# Patient Record
Sex: Male | Born: 1975 | Hispanic: Yes | Marital: Married | Smoking: Never smoker
Health system: Southern US, Community
[De-identification: ages and names within clinical notes are randomized; demographics above are authoritative.]

## PROBLEM LIST (undated history)

## (undated) HISTORY — PX: BRAIN SURGERY: SHX531

---

## 2014-01-16 ENCOUNTER — Encounter (HOSPITAL_COMMUNITY): Payer: Self-pay | Admitting: Emergency Medicine

## 2014-01-16 ENCOUNTER — Emergency Department (HOSPITAL_COMMUNITY): Payer: BC Managed Care – HMO

## 2014-01-16 ENCOUNTER — Emergency Department (HOSPITAL_COMMUNITY)
Admission: EM | Admit: 2014-01-16 | Discharge: 2014-01-16 | Disposition: A | Discharge status: Home / Self Care | Payer: BC Managed Care – HMO | Attending: Emergency Medicine | Admitting: Emergency Medicine

## 2014-01-16 ENCOUNTER — Emergency Department (INDEPENDENT_AMBULATORY_CARE_PROVIDER_SITE_OTHER)
Admission: EM | Admit: 2014-01-16 | Discharge: 2014-01-16 | Disposition: A | Discharge status: Nursing Home (Includes ICF) | Payer: BC Managed Care – HMO | Source: Home / Self Care | Attending: Emergency Medicine | Admitting: Emergency Medicine

## 2014-01-16 DIAGNOSIS — R079 Chest pain, unspecified: Secondary | ICD-10-CM | POA: Insufficient documentation

## 2014-01-16 DIAGNOSIS — Z79899 Other long term (current) drug therapy: Secondary | ICD-10-CM | POA: Insufficient documentation

## 2014-01-16 LAB — CBC WITH DIFFERENTIAL/PLATELET
Basophils Absolute: 0 10*3/uL (ref 0.0–0.1)
Basophils Relative: 1 % (ref 0–1)
Eosinophils Absolute: 0.1 10*3/uL (ref 0.0–0.7)
Eosinophils Relative: 2 % (ref 0–5)
HCT: 43 % (ref 39.0–52.0)
HEMOGLOBIN: 14.7 g/dL (ref 13.0–17.0)
Lymphocytes Relative: 44 % (ref 12–46)
Lymphs Abs: 2.5 10*3/uL (ref 0.7–4.0)
MCH: 30.8 pg (ref 26.0–34.0)
MCHC: 34.2 g/dL (ref 30.0–36.0)
MCV: 90.1 fL (ref 78.0–100.0)
MONOS PCT: 7 % (ref 3–12)
Monocytes Absolute: 0.4 10*3/uL (ref 0.1–1.0)
NEUTROS ABS: 2.6 10*3/uL (ref 1.7–7.7)
NEUTROS PCT: 46 % (ref 43–77)
Platelets: 210 10*3/uL (ref 150–400)
RBC: 4.77 MIL/uL (ref 4.22–5.81)
RDW: 12.4 % (ref 11.5–15.5)
WBC: 5.6 10*3/uL (ref 4.0–10.5)

## 2014-01-16 LAB — BASIC METABOLIC PANEL
BUN: 17 mg/dL (ref 6–23)
CHLORIDE: 104 meq/L (ref 96–112)
CO2: 27 mEq/L (ref 19–32)
Calcium: 9.2 mg/dL (ref 8.4–10.5)
Creatinine, Ser: 1.02 mg/dL (ref 0.50–1.35)
GFR calc non Af Amer: >90 mL/min (ref >90)
GLUCOSE: 105 mg/dL — AB (ref 70–99)
POTASSIUM: 4.5 meq/L (ref 3.7–5.3)
Sodium: 141 mEq/L (ref 137–147)

## 2014-01-16 LAB — I-STAT TROPONIN, ED: Troponin i, poc: 0.03 ng/mL (ref 0.00–0.08)

## 2014-01-16 MED ORDER — ASPIRIN 81 MG PO CHEW
CHEWABLE_TABLET | ORAL | Status: AC
  Filled 2014-01-16: qty 4

## 2014-01-16 MED ORDER — SODIUM CHLORIDE 0.9 % IV SOLN
INTRAVENOUS | Status: DC
  Administered 2014-01-16: 10:00:00 via INTRAVENOUS

## 2014-01-16 MED ORDER — NITROGLYCERIN 0.4 MG SL SUBL
SUBLINGUAL_TABLET | SUBLINGUAL | Status: AC
  Filled 2014-01-16: qty 1

## 2014-01-16 MED ORDER — NITROGLYCERIN 0.4 MG SL SUBL
0.4000 mg | SUBLINGUAL_TABLET | SUBLINGUAL | Status: AC | PRN
  Administered 2014-01-16: 0.4 mg via SUBLINGUAL

## 2014-01-16 MED ORDER — ASPIRIN 81 MG PO CHEW
324.0000 mg | CHEWABLE_TABLET | Freq: Once | ORAL | Status: AC
  Administered 2014-01-16: 324 mg via ORAL

## 2014-01-16 NOTE — Discharge Instructions (Signed)
Chest Pain (Nonspecific) °It is often hard to give a specific diagnosis for the cause of chest pain. There is always a chance that your pain could be related to something serious, such as a heart attack or a blood clot in the lungs. You need to follow up with your caregiver for further evaluation. °CAUSES  °· Heartburn. °· Pneumonia or bronchitis. °· Anxiety or stress. °· Inflammation around your heart (pericarditis) or lung (pleuritis or pleurisy). °· A blood clot in the lung. °· A collapsed lung (pneumothorax). It can develop suddenly on its own (spontaneous pneumothorax) or from injury (trauma) to the chest. °· Shingles infection (herpes zoster virus). °The chest wall is composed of bones, muscles, and cartilage. Any of these can be the source of the pain. °· The bones can be bruised by injury. °· The muscles or cartilage can be strained by coughing or overwork. °· The cartilage can be affected by inflammation and become sore (costochondritis). °DIAGNOSIS  °Lab tests or other studies, such as X-rays, electrocardiography, stress testing, or cardiac imaging, may be needed to find the cause of your pain.  °TREATMENT  °· Treatment depends on what may be causing your chest pain. Treatment may include: °· Acid blockers for heartburn. °· Anti-inflammatory medicine. °· Pain medicine for inflammatory conditions. °· Antibiotics if an infection is present. °· You may be advised to change lifestyle habits. This includes stopping smoking and avoiding alcohol, caffeine, and chocolate. °· You may be advised to keep your head raised (elevated) when sleeping. This reduces the chance of acid going backward from your stomach into your esophagus. °· Most of the time, nonspecific chest pain will improve within 2 to 3 days with rest and mild pain medicine. °HOME CARE INSTRUCTIONS  °· If antibiotics were prescribed, take your antibiotics as directed. Finish them even if you start to feel better. °· For the next few days, avoid physical  activities that bring on chest pain. Continue physical activities as directed. °· Do not smoke. °· Avoid drinking alcohol. °· Only take over-the-counter or prescription medicine for pain, discomfort, or fever as directed by your caregiver. °· Follow your caregiver's suggestions for further testing if your chest pain does not go away. °· Keep any follow-up appointments you made. If you do not go to an appointment, you could develop lasting (chronic) problems with pain. If there is any problem keeping an appointment, you must call to reschedule. °SEEK MEDICAL CARE IF:  °· You think you are having problems from the medicine you are taking. Read your medicine instructions carefully. °· Your chest pain does not go away, even after treatment. °· You develop a rash with blisters on your chest. °SEEK IMMEDIATE MEDICAL CARE IF:  °· You have increased chest pain or pain that spreads to your arm, neck, jaw, back, or abdomen. °· You develop shortness of breath, an increasing cough, or you are coughing up blood. °· You have severe back or abdominal pain, feel nauseous, or vomit. °· You develop severe weakness, fainting, or chills. °· You have a fever. °THIS IS AN EMERGENCY. Do not wait to see if the pain will go away. Get medical help at once. Call your local emergency services (911 in [place] Do not drive yourself to the hospital. °MAKE SURE YOU:  °· Understand these instructions. °· Will watch your condition. °· Will get help right away if you are not doing well or get worse. °Document Released: 05/06/2005 Document Revised: 10/19/2011 Document Reviewed: 03/01/2008 °ExitCare® Patient Information ©2014 [employer],   LLC. ° ° ° °Emergency Department Resource Guide °1) Find a Doctor and Pay Out of Pocket °Although you won't have to find out who is covered by your insurance plan, it is a good idea to ask around and get recommendations. You will then need to call the office and see if the doctor you have chosen will accept you as a new  patient and what types of options they offer for patients who are self-pay. Some doctors offer discounts or will set up payment plans for their patients who do not have insurance, but you will need to ask so you aren't surprised when you get to your appointment. ° °2) Contact Your Local Health Department °Not all health departments have doctors that can see patients for sick visits, but many do, so it is worth a call to see if yours does. If you don't know where your local health department is, you can check in your phone book. The CDC also has a tool to help you locate your state's health department, and many state websites also have listings of all of their local health departments. ° °3) Find a Walk-in Clinic °If your illness is not likely to be very severe or complicated, you may want to try a walk in clinic. These are popping up all over the country in pharmacies, drugstores, and shopping centers. They're usually staffed by nurse practitioners or physician assistants that have been trained to treat common illnesses and complaints. They're usually fairly quick and inexpensive. However, if you have serious medical issues or chronic medical problems, these are probably not your best option. ° °No Primary Care Doctor: °- Call [facility] at  832-8000 - they can help you locate a primary care doctor that  accepts your insurance, provides certain services, etc. °- Physician Referral Service- 1-800-533-3463 ° °Chronic Pain Problems: °Organization         Address  Phone   Notes  °[facility]  (336) 297-2271 Patients need to be referred by their primary care doctor.  ° °Medication Assistance: °Organization         Address  Phone   Notes  °[place] [facility] [address] °[place], [place] [address] (336) 641-8030 --Must be a resident of [place] °-- Must have NO insurance coverage whatsoever (no Medicaid/ Medicare, etc.) °-- The pt. MUST have a primary  care doctor that directs their care regularly and follows them in the community °  °[facility]  (866) 331-1348   °[employer]  (888) 892-1162   ° °Agencies that provide inexpensive medical care: °Organization         Address  Phone   Notes  °[facility]  (336) 832-8035   °[facility]    (336) 832-7272   °[facility] [address] °[place], [place] [address] (336) 832-4777   °[facility] [address], °[place] (336) 271-4999   °[facility]    (336) 373-0678   °[facility]    (336) 272-1050   °[facility] ° [address], [place] Phone:  (336) 832-4444, Fax:  (336) 832-4440 Hours of Operation:  9 am - 6 pm, M-F.  Also accepts Medicaid/Medicare and self-pay.  °[facility] ° [address], [place] Phone: (336) [phone], Fax: (336) [phone]. Hours of Operation:  8:30 am - 5:30 pm, M-F.  Also accepts Medicaid and self-pay.  °  [facility] [place] [address], [place] Phone: (336) 878-6027   °[facility] [address], [place], [place] (336)723-1848, Ext. 123 Mondays & Thursdays: 7-9 AM.  First 15 patients are seen on a first come, first serve basis. °  ° °Medicaid-accepting [place] Providers: ° °Organization         Address  Phone   Notes  °[facility] [address], [place] (336) 641-2100 Also accepts self-pay patients.  °[facility] [address], [place] ° (336) 856-9996   °[facility] [address] [place] [address], [place] (336) 288-8857   °[facility] [address] [place] [address], [place] (336) 299-7000   °Veita Bland [address], [place]  ° (336) 373-1557 Only accepts [place] Access Medicaid patients after they have their name applied to their card.  ° °Self-Pay (no insurance) in [place]: ° °Organization         Address  Phone   Notes  °Sickle Cell Patients, [facility] [address], [place] (336) 832-1970   °[facility] [address], [place] (336) 832-4400   °[facility] ° [address], [place] (336) 992-4800   °[facility]/Dr. Osei-Bonsu ° [address] [place] [address], [employer] [place] or [address], [place] (336) [phone] Phone number for both [place] and [place] locations is the same.  °[facility] [address], [place] (336) 299-0000   °[facility] [address] [place] [address], [place] or [address] (336) 852-7530 °(336) 878-2260   °[facility] [address], [place] (336) 350-1642, phone; (336) 294-5005, fax Sees patients 1st and 3rd Saturday of every month.  Must not qualify for public or private insurance (i.e. Medicaid, Medicare, [place] Health Choice, Veterans' Benefits) • Household income should be no more than 200% of the poverty level •The clinic cannot treat you if you are pregnant or think you are pregnant • Sexually transmitted diseases are not treated at the clinic.  ° ° °Dental Care: °Organization         Address  Phone  Notes  °[facility] [facility] [address], [place] (336) 641-6152 Accepts children up to age 21 who are enrolled in Medicaid or [place] Health Choice; pregnant women with a Medicaid card; and children who have applied for Medicaid or [place] Health Choice, but were declined, whose parents can pay a reduced fee at time of service.  °[facility] [place]  [address], [place] (336) 641-7733 Accepts children up to age 21 who are enrolled in Medicaid or [place] Health Choice; pregnant women with a Medicaid card; and children who have applied for Medicaid or [place] Health Choice, but were declined, whose parents can  pay a reduced fee at time of service.  °[facility] ° [address],  (336) 641-4533 Patients are seen by appointment only. Walk-ins are not accepted. [facility] will see patients 18 years of age and older. °Monday - Tuesday (8am-5pm) °Most Wednesdays (8:30-5pm) °$30 per visit, cash only  °[facility] ° [address], [place] (336) 641-4533 Patients are seen by appointment only. Walk-ins are not accepted. [facility] will see patients 18 years of   age and older. °One Wednesday Evening (Monthly: Volunteer Based).  $30 per visit, cash only  °[facility]  (919) 537-3737 for adults; Children under age 4, call Graduate Pediatric Dentistry at ([phone]) [phone]. Children aged 4-14, please call ([phone]) [phone] to request a pediatric application. ° Dental services are provided in all areas of dental care including fillings, crowns and bridges, complete and partial dentures, implants, gum treatment, root canals, and extractions. Preventive care is also provided. Treatment is provided to both adults and children. °Patients are selected via a lottery and there is often a waiting list. °  °[facility] [address], °[place] ° (336) 763-8833 [email or web address] °  °[facility] [address], [place], [place] (336)723-1848, Ext. 123 Second and Fourth Thursday of each month, opens at 6:30 AM; Clinic ends at 9 AM.  Patients are seen on a first-come first-served basis, and a limited number are seen during each clinic.  ° °[facility] ° [address], [place], [place] (336) 723-7904   Eligibility Requirements °You must have lived in [place], [place], or [place] counties for at least the last three months. °  You cannot be eligible for state or federal sponsored healthcare insurance, including Veterans Administration, Medicaid, or Medicare. °  You generally cannot be eligible for healthcare  insurance through your employer.  °  How to apply: °Eligibility screenings are held every Tuesday and Wednesday afternoon from 1:00 pm until 4:00 pm. You do not need an appointment for the interview!  °[facility] [address], [place], [place] 336-631-2330   °[facility]  336-342-8273   °[facility]  336-703-3100   °[facility]  336-570-6415   ° °Behavioral Health Resources in the Community: °Intensive Outpatient Programs °Organization         Address  Phone  Notes  °[facility] [address], [place], [place] 336-878-6098   °[facility] [address], [place], [place] 336-832-9800   °[facility] [address], [place], [place] ° 336-882-2125   °[facility] [address],  °[place], [place] 1-800-853-5163 or 336-641-4981   °Substance Abuse Resources °Organization         Address  Phone  Notes  °[facility]  336-882-2125   °[facility]  336-784-9470   °[facility]  336-285-9073   °[facility]  336-845-3988   °Residential & Outpatient Substance Abuse Program  1-800-659-3381   °Psychological Services °Organization         Address  Phone  Notes  °[facility]  336- 832-9600   °[facility]  336- 378-7881   °[facility] [address], [place] 1-800-853-5163 or 336-641-4981   ° °Mobile Crisis Teams °Organization         Address  Phone  Notes  °[facility], Mobile Crisis Care Unit  1-877-626-1772   °Assertive °[facility] ° [address] [place], [place] 336-834-9664   °Sharon DeEsch [address] °[place] 336-554-5454   ° °Self-Help/Support Groups °Organization         Address  Phone             Notes  °[facility] [place] - variety of support groups  336- 373-1402 Call for more information  °Narcotics Anonymous (NA),  [facility] [address], °[place]   2   meetings at this location  ° °Residential Treatment Programs °Organization         Address  Phone  Notes  °[facility] [address],    °[place]  1-866-801-8205   °[facility] ° [address], [place], [place] 704-293-8524   °[facility] [address], [place] 336-845-3988 Admissions: 8am-3pm M-F  °[facility] [address]    °[place], [place] 336-841-1104   °[facility] [address], [place], [place] 336-379-7146   °[facility] [address]  °[place]  336-285-9073   °[facility] Intensive Outpatient [address], [place], [place] 336-852-3033   °[facility] [address]  °[place], [place] 1-877-615-2722 or 336-784-9470   °[facility] [address] [place], [place] 336-227-7417 Accepts Medicaid  °[facility] [address]  °[place] 1-800-659-3381 Substance Abuse/Addiction Treatment  ° °[place] Behavioral Health Resources °Organization         Address  Phone  Notes  °[facility]  (888) 581-9988   °Julie Brannon, PhD [address] [place], [place]   (336) 349-5553 or (336) 951-0000   °[facility]   [address] °[place], [place] (336) 349-4454   °[facility] [address], [place], [place] (336) 342-8316 Insurance/Medicaid/sponsorship through [facility]  °[facility] [address]                                    [place], [place] (336) 342-8316 Therapy/tele-psych/case  °[facility] [address]  ° [place], [place] (336) 349-2233    °Dr. Arfeen  (336) 349-4544   °[facility] [place]  [employer] [place] [facility] 1) [address], [place] °2) [address], [place] °3)  [address], [place] (336) 349-3220 °(336) 342-7768 ° °(336) 342-8140    °[place] [facility] (336) 342-1394 or (336) 342-3537 (After Hours)    ° ° ° °

## 2014-01-16 NOTE — ED Notes (Signed)
Pt comfortable with discharge and follow up instructions. No prescriptions. 

## 2014-01-16 NOTE — ED Provider Notes (Addendum)
Chief Complaint   Chief Complaint  Patient presents with  . 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 The arm and hand feel somewhat numb and tingly. There is sensitivity to touch in the chest and arm. He denies any associated nausea, diaphoresis, or shortness of breath. He has not felt weak or dizzy. The patient ran a 5 km race, pushing to of his children in a stroller, and helped a friend move some heavy furniture in the several days prior to the onset of the chest discomfort. He denies fever, chills, coughing, wheezing, shortness of breath. He's had no palpitations, dizziness, syncope, or GI symptoms. No extremity pain or swelling. He's not had a history of cardiac disease in the past and has had no risk factors.  Review of Systems    Other than noted above, the patient denies any of the following symptoms. Systemic:  No fever or chills. Pulmonary:  No cough, wheezing, shortness of breath, sputum production, hemoptysis. Cardiac:  No palpitations, rapid heartbeat, dizziness, presyncope or syncope. GI:  No abdominal pain, heartburn, nausea, or vomiting. Ext:  No leg pain or swelling.  PMFSH    Past medical history, family history, social history, meds, and allergies were reviewed. He's allergic to penicillin.  Physical Exam     Vital signs:  BP 133/80  Pulse 68  Temp(Src) 98.2 F (36.8 C) (Oral)  Resp 14  SpO2 100% Gen:  Alert, oriented, in no distress, skin warm and dry. Eye:  PERRL, lids and conjunctivas normal.  Sclera non-icteric. ENT:  Mucous membranes moist, pharynx clear. Neck:  Supple, no adenopathy or tenderness.  No JVD. Lungs:  Clear to auscultation, no wheezes, rales or rhonchi.  No respiratory distress. Heart:  Regular rhythm.  No gallops, murmers, clicks or rubs. Chest:   No chest wall tenderness. Abdomen:  Soft, nontender, no organomegaly or mass.  Bowel sounds normal.  No pulsatile abdominal mass or bruit. Ext:  No edema.  No calf tenderness and Homann's sign negative.  Pulses full and equal. Skin:  Warm and dry.  No rash.  EKG Results:  Date: 01/16/2014  Rate: 63  Rhythm: normal sinus rhythm  QRS Axis: normal  Intervals: normal  ST/T Wave abnormalities: nonspecific ST changes  Conduction Disutrbances:none  Narrative Interpretation:  Normal sinus rhythm, nonspecific ST changes, and normal QRS-T. angle, consider primary T-wave abnormality, abnormal EKG  Old EKG Reviewed: none available    Course in Urgent Care Center         Patient was begun on IV normal saline, oxygen, and given nitroglycerin 0.4 mg sublingually and aspirin 325 mg sublingually. He'll be transferred to the emergency department via [facility].  Assessment     The encounter diagnosis was Chest pain.  Plan     The patient was transferred to the ED via [facility] in stable condition.  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  No nausea, diaphoresis, or shortness of breath.  No known history of heart disease or risk factors.  EKG shows non-specific ST-T changes and an abnormal QRS-T angle.  We will give TNG and ASA. Transport to be via [employer].         Reuben Likes, MD 01/16/14 [id number]  Reuben Likes, MD 01/16/14 [phone]

## 2014-01-16 NOTE — ED Notes (Signed)
Provider at the bedside.  

## 2014-01-16 NOTE — ED Provider Notes (Signed)
CSN: [id number]     Arrival date & time 01/16/14  1017 History   First MD Initiated Contact with Patient 01/16/14 1033     Chief Complaint  Patient presents with  . Chest Pain     (Consider location/radiation/quality/duration/timing/severity/associated sxs/prior Treatment) HPI Comments: Patient presents today with a chief complaint of chest pain.  He reports that the pain has been present since yesterday afternoon.  He was sitting at a desk at work at the onset of pain  He reports that the pain has been constant since that time.  He reports that initially he had pain of his left lower arm, but the pain is now located in both his arm and his chest.  He describes the pain as a dull pulsating pain.  He was seen at [facility] just prior to arrival.  He was given 324 mg ASA and NG x 1 there, which helped the pain.  He reports that the pain is worse with palpation.  He reports that he recently ran a 5 KM race and also helped his friend move a couple days ago.  He reports that he did not have any chest pain when he ran the 5 K.  He denies nausea, vomiting, dizziness, diaphoresis, or SOB.  He denies any prior cardiac history.  He denies any history of HTN, Hyperlipidemia, or DM.  No family history of Cardiac disease.  He does not smoke.  He denies any history of DVT or PE.  Denies surgery or prolonged travel in the past 4 weeks.  Denies any LE edema or pain.  The history is provided by the patient.    History reviewed. No pertinent past medical history. Past Surgical History  Procedure Laterality Date  . Brain surgery     History reviewed. No pertinent family history. History  Substance Use Topics  . Smoking status: Never Smoker   . Smokeless tobacco: Not on file  . Alcohol Use: No    Review of Systems  All other systems reviewed and are negative.     Allergies  Review of patient's allergies indicates no known allergies.  Home Medications   Prior to Admission medications   Medication Sig  Start Date End Date Taking? Authorizing Provider  Fexofenadine HCl (ALLEGRA PO) Take 1 tablet by mouth daily.   Yes Historical Provider, MD  Multiple Vitamin (MULTIVITAMIN WITH MINERALS) TABS tablet Take 1 tablet by mouth daily.   Yes Historical Provider, MD  Omega-3 Fatty Acids (FISH OIL PO) Take 1 tablet by mouth daily.   Yes Historical Provider, MD   BP 112/64  Pulse 66  Temp(Src) 98.6 F (37 C) (Oral)  Resp 22  SpO2 99% Physical Exam  Constitutional: He appears well-developed and well-nourished.  HENT:  Head: Normocephalic and atraumatic.  Mouth/Throat: Oropharynx is clear and moist.  Neck: Normal range of motion. Neck supple.  Cardiovascular: Normal rate, regular rhythm, normal heart sounds and intact distal pulses.   Pulmonary/Chest: Effort normal and breath sounds normal. No respiratory distress. He has no wheezes. He has no rales. He exhibits no tenderness.  Abdominal: Soft. There is no tenderness.  Musculoskeletal:  No LE edema bilaterally  Neurological: He is alert.  Skin: Skin is warm and dry. He is not diaphoretic.  Psychiatric: He has a normal mood and affect.    ED Course  Procedures (including critical care time) Labs Review Labs Reviewed  CBC WITH DIFFERENTIAL  BASIC METABOLIC PANEL  Rosezena Sensor, ED    Imaging Review Dg Chest  2 View  01/16/2014   CLINICAL DATA:  Chest pain extending into the left arm.  EXAM: CHEST  2 VIEW  COMPARISON:  None.  FINDINGS: Heart size and pulmonary vascularity are normal and the lungs are clear. No effusions. No acute osseous abnormality. Slight thoracic scoliosis.  IMPRESSION: No acute abnormality.   Electronically Signed   By: Geanie CooleyJim  Maxwell M.D.   On: 01/16/2014 11:09     EKG Interpretation   Date/Time:  Tuesday January 16 2014 10:25:27 EDT Ventricular Rate:  64 PR Interval:  157 QRS Duration: 83 QT Interval:  385 QTC Calculation: 397 R Axis:   77 Text Interpretation:  Sinus rhythm Nonspecific ST abnormality No   significant change since last tracing Confirmed by Denton LankSTEINL  MD, Caryn BeeKEVIN  ([id number]) on 01/16/2014 11:07:14 AM    Patient discussed and also evaluated by Dr. Denton LankSteinl.    MDM   Final diagnoses:  None   Patient presents today with a chief complaint of chest pain.  Pain has been present since yesterday afternoon and has been constant since that time.  Pain worse with palpation.  Patient recently helped his friend move.  Suspect that the pain is musculoskeletal.  Patient with a heart score of 1.  No ischemic changes on EKG.  Troponin negative.  CXR also negative.  Patient is PERC negative.  Feel that the patient is stable for discharge.  Patient instructed to follow up with PCP.  Return precautions given.  Dr. Denton LankSteinl in agreement with the plan.      Santiago GladHeather Retha Bither, PA-C 01/16/14 1504

## 2014-01-16 NOTE — ED Notes (Signed)
Patient transported to X-ray 

## 2014-01-16 NOTE — Discharge Instructions (Signed)
We have determined that your problem requires further evaluation in the emergency department.  We will take care of your transport there.  Once at the emergency department, you will be evaluated by a provider and they will order whatever treatment or tests they deem necessary.  We cannot guarantee that they will do any specific test or do any specific treatment.  ° °

## 2014-01-16 NOTE — ED Notes (Signed)
Report given to [facility]. 

## 2014-01-16 NOTE — ED Notes (Signed)
Pt to department via [facility] from [facility] for chest pain- pt reports that started 2 days ago. Reports that the pain starts in his chest pain and radiates into the left arm. Pt received 324 ASA, 1 nitro and 20g LAC. Reports pain a 1/10 at this time. Bp-120/83 Hr-77 RR-18

## 2014-01-16 NOTE — ED Notes (Signed)
Pt undressed, in gown, on monitor, bp cuff and pulse oximetry

## 2014-01-16 NOTE — ED Notes (Signed)
Pt c/o chest pain onset last night Reports pain starts from sternum area and radiates to his left arm down to the hand States it's sensitive w/touch/pressure; mild numbness of left hand Denies SOB, weakness, diaphoresis Reports he helped a friend move Sunday night He is alert and talking in complete sentences w/no signs of acute distress.

## 2014-01-17 NOTE — Discharge Planning (Signed)
[facility] Liaison was not able to see patient, [facility] orange card information and resource guide will be mailed to the address listed. °

## 2014-01-17 NOTE — ED Provider Notes (Signed)
Medical screening examination/treatment/procedure(s) were conducted as a shared visit with non-physician practitioner(s) and myself.  I personally evaluated the patient during the encounter.   EKG Interpretation   Date/Time:  Tuesday January 16 2014 10:25:27 EDT Ventricular Rate:  64 PR Interval:  157 QRS Duration: 83 QT Interval:  385 QTC Calculation: 397 R Axis:   77 Text Interpretation:  Sinus rhythm Nonspecific ST abnormality No  significant change since last tracing Confirmed by Mckala Pantaleon  MD, Caryn Bee  ([id number]) on 01/16/2014 11:07:14 AM      Pt c/o pain that started on lateral surface left forearm. States moved up towards shoulder/antlat chest. No neck/radicular pain. No exertional cp or discomfort. Ran 5k in past couple days w no sob or cp. No arm numbness/weakness. Radial pulse 2+. No LUE swelling or erythema. Chest cta. Rrr.   Suzi Roots, MD 01/17/14 [phone]

## 2018-02-18 ENCOUNTER — Other Ambulatory Visit: Payer: Self-pay

## 2018-02-18 ENCOUNTER — Emergency Department (HOSPITAL_COMMUNITY)
Admission: EM | Admit: 2018-02-18 | Discharge: 2018-02-18 | Disposition: A | Discharge status: Home / Self Care | Payer: BLUE CROSS/BLUE SHIELD | Attending: Emergency Medicine | Admitting: Emergency Medicine

## 2018-02-18 ENCOUNTER — Encounter (HOSPITAL_COMMUNITY): Payer: Self-pay | Admitting: Emergency Medicine

## 2018-02-18 ENCOUNTER — Other Ambulatory Visit (HOSPITAL_COMMUNITY): Payer: BLUE CROSS/BLUE SHIELD

## 2018-02-18 ENCOUNTER — Emergency Department (HOSPITAL_COMMUNITY): Payer: BLUE CROSS/BLUE SHIELD

## 2018-02-18 DIAGNOSIS — Z79899 Other long term (current) drug therapy: Secondary | ICD-10-CM | POA: Diagnosis not present

## 2018-02-18 DIAGNOSIS — R531 Weakness: Secondary | ICD-10-CM | POA: Diagnosis not present

## 2018-02-18 DIAGNOSIS — R1310 Dysphagia, unspecified: Secondary | ICD-10-CM | POA: Diagnosis not present

## 2018-02-18 LAB — CBC WITH DIFFERENTIAL/PLATELET
Basophils Absolute: 0 10*3/uL (ref 0.0–0.1)
Basophils Relative: 0 %
EOS ABS: 0.1 10*3/uL (ref 0.0–0.7)
EOS PCT: 1 %
HCT: 47.3 % (ref 39.0–52.0)
Hemoglobin: 16 g/dL (ref 13.0–17.0)
LYMPHS PCT: 16 %
Lymphs Abs: 1.2 10*3/uL (ref 0.7–4.0)
MCH: 30.8 pg (ref 26.0–34.0)
MCHC: 33.8 g/dL (ref 30.0–36.0)
MCV: 91.1 fL (ref 78.0–100.0)
Monocytes Absolute: 0.4 10*3/uL (ref 0.1–1.0)
Monocytes Relative: 6 %
NEUTROS PCT: 77 %
Neutro Abs: 5.6 10*3/uL (ref 1.7–7.7)
Platelets: 261 10*3/uL (ref 150–400)
RBC: 5.19 MIL/uL (ref 4.22–5.81)
RDW: 12.2 % (ref 11.5–15.5)
WBC: 7.3 10*3/uL (ref 4.0–10.5)

## 2018-02-18 LAB — BASIC METABOLIC PANEL
Anion gap: 9 (ref 5–15)
BUN: 15 mg/dL (ref 6–20)
CO2: 23 mmol/L (ref 22–32)
Calcium: 9.5 mg/dL (ref 8.9–10.3)
Chloride: 107 mmol/L (ref 98–111)
Creatinine, Ser: 1.12 mg/dL (ref 0.61–1.24)
GFR calc Af Amer: >60 mL/min (ref >60)
GFR calc non Af Amer: >60 mL/min (ref >60)
Glucose, Bld: 123 mg/dL — ABNORMAL HIGH (ref 70–99)
POTASSIUM: 4.5 mmol/L (ref 3.5–5.1)
Sodium: 139 mmol/L (ref 135–145)

## 2018-02-18 LAB — URINALYSIS, ROUTINE W REFLEX MICROSCOPIC
Bilirubin Urine: NEGATIVE
Glucose, UA: NEGATIVE mg/dL
HGB URINE DIPSTICK: NEGATIVE
Ketones, ur: 20 mg/dL — AB
Leukocytes, UA: NEGATIVE
NITRITE: NEGATIVE
Protein, ur: NEGATIVE mg/dL
SPECIFIC GRAVITY, URINE: 1.017 (ref 1.005–1.030)
pH: 6 (ref 5.0–8.0)

## 2018-02-18 LAB — TSH: TSH: 1.339 u[IU]/mL (ref 0.350–4.500)

## 2018-02-18 LAB — CK: Total CK: 165 U/L (ref 49–397)

## 2018-02-18 LAB — C-REACTIVE PROTEIN

## 2018-02-18 LAB — SEDIMENTATION RATE: SED RATE: 3 mm/h (ref 0–16)

## 2018-02-18 LAB — MAGNESIUM: MAGNESIUM: 2.3 mg/dL (ref 1.7–2.4)

## 2018-02-18 MED ORDER — GADOBENATE DIMEGLUMINE 529 MG/ML IV SOLN
20.0000 mL | Freq: Once | INTRAVENOUS | Status: AC
  Administered 2018-02-18: 20 mL via INTRAVENOUS

## 2018-02-18 NOTE — ED Notes (Signed)
Neuro provider requested glass of water to test swallowing

## 2018-02-18 NOTE — Discharge Instructions (Addendum)
If you were given medicines take as directed.  If you are on coumadin or contraceptives realize their levels and effectiveness is altered by many different medicines.  If you have any reaction (rash, tongues swelling, other) to the medicines stop taking and see a physician.    If your blood pressure was elevated in the ER make sure you follow up for management with a primary doctor or return for chest pain, shortness of breath or stroke symptoms.  Please follow up as directed and return to the ER or see a physician for new or worsening symptoms.  Thank you. Vitals:   02/18/18 1730 02/18/18 1745 02/18/18 1800 02/18/18 1815  BP: 133/88 128/86 (!) 143/99 (!) 140/91  Pulse: 72 72 80 77  Resp: 18     Temp: 98.2 F (36.8 C)     TempSrc: Oral     SpO2: 100% 99% 100% 100%

## 2018-02-18 NOTE — Consult Note (Addendum)
Requesting Physician: Dr. Jodi MourningZavitz    Chief Complaint: Dysphagia  History obtained from: Patient and Chart    HPI:                                                                                                                                       Collin Clark is an 42 y.o. male who presents to the emergency room after having difficulty with swallowing after spraying wasp insecticide on July 4.    He states that about 2 hours  after spraying insecticide, he had some itching in his throat.  The following day he noticed he had some difficulty swallowing that has progressively gotten worse.  He has no problems swallowing liquids but has difficulty with solids.  He also has been having generalized weakness that is worsening as the day goes by.  Yesterday he was at a grocery store when he had severe weakness in both his legs and was hardly able to walk.  He woke up today morning feeling much better but was concerned and came to the emergency room.  He also notices twitching in different muscles on occasions and is concerned if he has ALS.  Other complaints include numbness/tingling of both hands.  He denies any double vision.  Denies any weakness in one arm or leg.  No shortness of breath.  Poison control was called by EDP-confirmed that insecticide did not have any organophosphat  Patient states that he does  with his take honey with tea.   History reviewed. No pertinent past medical history.  Past Surgical History:  Procedure Laterality Date  . BRAIN SURGERY      History reviewed. No pertinent family history. Social History:  reports that he has never smoked. He has never used smokeless tobacco. He reports that he does not drink alcohol or use drugs.  Allergies: No Known Allergies  Medications:                                                                                                                        I reviewed home medications.  No medications at home   ROS:  14 systems reviewed and negative except above    Examination:                                                                                                      General: Appears well-developed and well-nourished.  Psych: Affect appropriate to situation Eyes: No scleral injection HENT: No OP obstrucion Head: Normocephalic.  Cardiovascular: Normal rate and regular rhythm.  Respiratory: Effort normal and breath sounds normal to anterior ascultation. Single breath count greater than 20 GI: Soft.  No distension. There is no tenderness.  Skin: WDI    Neurological Examination Mental Status: Alert, oriented, thought content appropriate.  Speech fluent without evidence of aphasia. Able to follow 3 step commands without difficulty. Cranial Nerves: II: Visual fields grossly normal,  III,IV, VI: ptosis not present, extra-ocular motions intact bilaterally, pupils equal, round, reactive to light and accommodation V,VII: smile symmetric, facial light touch sensation normal bilaterally VIII: hearing normal bilaterally IX,X: uvula rises symmetrically, Gag reflex intact XI: bilateral shoulder shrug XII: midline tongue extension Motor: Right : Upper extremity   5/5    Left:     Upper extremity   5/5  Lower extremity   5/5     Lower extremity   5/5 Tone and bulk:normal tone throughout; no atrophy noted Sensory: Pinprick and light touch intact throughout, bilaterally Deep Tendon Reflexes: 2+ and symmetric throughout Plantars: Right: downgoing   Left: downgoing Cerebellar: normal finger-to-nose, normal rapid alternating movements and normal heel-to-shin test Gait: normal gait and station     Lab Results: Basic Metabolic Panel: Recent Labs  Lab 02/18/18 1532 02/18/18 1938  NA 139  --   K 4.5  --   CL 107  --   CO2 23  --   GLUCOSE 123*  --   BUN 15  --   CREATININE 1.12   --   CALCIUM 9.5  --   MG  --  2.3    CBC: Recent Labs  Lab 02/18/18 1532  WBC 7.3  NEUTROABS 5.6  HGB 16.0  HCT 47.3  MCV 91.1  PLT 261    Coagulation Studies: No results for input(s): LABPROT, INR in the last 72 hours.  Imaging: Mr Laqueta Jean And Wo Contrast  Result Date: 02/18/2018 CLINICAL DATA:  Initial evaluation with acute difficulty swallowing. EXAM: MRI HEAD WITHOUT AND WITH CONTRAST TECHNIQUE: Multiplanar, multiecho pulse sequences of the brain and surrounding structures were obtained without and with intravenous contrast. CONTRAST:  20mL MULTIHANCE GADOBENATE DIMEGLUMINE 529 MG/ML IV SOLN COMPARISON:  None. FINDINGS: Brain: Cerebral volume within normal limits for age. No focal parenchymal signal abnormality identified. No abnormal foci of restricted diffusion to suggest acute or subacute ischemia. Gray-white matter differentiation maintained. No encephalomalacia to suggest chronic infarction. No evidence for acute intracranial hemorrhage. Single focus subcentimeter focus of susceptibility artifact noted within the periventricular white matter of the posterior left corona radiata (series 12, image 33), indeterminate, but of doubtful significance in isolation. No other evidence for chronic hemorrhage. No mass lesion, midline shift or mass effect. No hydrocephalus. No extra-axial fluid collection. Pituitary  gland suprasellar region normal. No abnormal enhancement. Vascular: Major intracranial vascular flow voids are well maintained. Skull and upper cervical spine: Craniocervical junction within normal limits. Upper cervical spine normal. Bone marrow signal intensity within normal limits. No scalp soft tissue abnormality. Sinuses/Orbits: Globes and orbital soft tissues within normal limits. Mild mucosal thickening within the inferior maxillary sinuses. Paranasal sinuses are otherwise clear. No mastoid effusion. Inner ear structures normal. Other: None. IMPRESSION: Normal brain MRI for  age. No acute intracranial abnormality identified. Electronically Signed   By: Rise Mu M.D.   On: 02/18/2018 22:50     I have reviewed the above imaging : MR Arlys John w/wo contrast   ASSESSMENT AND PLAN    Dysphagia, progressive weakness that worsens during the day   Impression: D/D includes Myasthenia gravis, botulism, anxiety Patient not in neuro muscular crisis, has normal strength and no shortness of breath   Plan CT head Check CK enzymes, ACH antibodies, TSH Barium swallow as outpatient, EMG/NCS as outpatient  Counseled patient on warning signs and to return to the emergency room if he worsens MRI brain with and without contrast was performed: no acute findings F/U with Outpatient neurology   Sushanth Aroor [facility] Pager Number [phone]

## 2018-02-18 NOTE — ED Notes (Signed)
Pt states that he has never had "brain surgery as they said I had in MRI."

## 2018-02-18 NOTE — ED Notes (Signed)
Patient transported to MRI 

## 2018-02-18 NOTE — ED Provider Notes (Signed)
MSE was initiated and I personally evaluated the patient and placed orders (if any) at  3:13 PM on February 18, 2018.  The patient appears stable so that the remainder of the MSE may be completed by another provider.  Patient placed in Quick Look pathway, seen and evaluated   Chief Complaint: dysphagia  HPI:   Patient presents for dysphasia since 7/4.  States that he was spraying some insecticide when he felt like he got some of it stuck in his throat.  He feels that he has having trouble swallowing his food.  States that it feels like it will be stuck in his throat or in his esophagus.  He feels that his throat is "a hole that is not closing."  States that he cannot feel the food going down his throat.  When this happens, he is still able to tolerate liquids.  Ate lunch today with similar symptoms. He denies any vomiting.  He also complains of generalized weakness.  States that he is having cramping and weakness in his lower legs that [place] [employer] [place]  Feels like he drooled while speaking last week. Reports perioral numbness last week as well. He is unsure if this is related to the fact that he has had to decrease his p.o. intake and eat less.  Denies any injuries or falls, numbness in legs, loss of bowel or bladder function, chest pain.  ROS: Dysphasia  Physical Exam:   Gen: No distress  Neuro: Awake and Alert  Skin: Warm    Focused Exam: Patient is anxious and tearful.  Tolerating secretions.  No changes in voice. Oropharynx is widely patent. Strength 5/5 in bilateral lower extremities.  Normal gait noted.   Initiation of care has begun. The patient has been counseled on the process, plan, and necessity for staying for the completion/evaluation, and the remainder of the medical screening examination    Dietrich PatesKhatri, Jalei Shibley, PA-C 02/18/18 1601    Gerhard MunchLockwood, Robert, MD 02/18/18 2253

## 2018-02-18 NOTE — ED Triage Notes (Signed)
Pt to ER for evaluation of difficulty swallowing onset one week ago. Reports is able to get food down, just in small amounts. Pt a/o x4 NAD.

## 2018-02-18 NOTE — ED Notes (Signed)
Neuro provider bedside 

## 2018-02-18 NOTE — ED Provider Notes (Signed)
Collin Clark [facility] EMERGENCY DEPARTMENT Provider Note   CSN: [id number] Arrival date & time: 02/18/18  1447     History   Chief Complaint Chief Complaint  Patient presents with  . Dysphagia    HPI [name] is a 42 y.o. male.  Patient with no significant medical problems, social alcohol, no tobacco use presents with general weakness worse in the legs and difficulty swallowing that has worsened since July 4.  At that time used wasp insecticide spray almost the entire can and shortly after that he felt some difficulty with swallowing that gradually worsened in the past few days he has noticed a difficulty with feeling heaviness in his legs and general weakness.  Patient has no family history of neurologic conditions.  No diagnosis of neurologic conditions in the past.  No other drugs or medications.     History reviewed. No pertinent past medical history.  There are no active problems to display for this patient.   Past Surgical History:  Procedure Laterality Date  . BRAIN SURGERY          Home Medications    Prior to Admission medications   Medication Sig Start Date End Date Taking? Authorizing Provider  Fexofenadine HCl (ALLEGRA PO) Take 1 tablet by mouth daily.    [provider]  Multiple Vitamin (MULTIVITAMIN WITH MINERALS) TABS tablet Take 1 tablet by mouth daily.    [provider]  Omega-3 Fatty Acids (FISH OIL PO) Take 1 tablet by mouth daily.    [provider]    Family History History reviewed. No pertinent family history.  Social History Social History   Tobacco Use  . Smoking status: Never Smoker  . Smokeless tobacco: Never Used  Substance Use Topics  . Alcohol use: No  . Drug use: No     Allergies   Patient has no known allergies.   Review of Systems Review of Systems  Constitutional: Negative for chills and fever.  HENT: Negative for congestion.   Eyes: Negative for visual disturbance.    Respiratory: Negative for shortness of breath.   Cardiovascular: Negative for chest pain.  Gastrointestinal: Negative for abdominal pain and vomiting.  Genitourinary: Negative for dysuria and flank pain.  Musculoskeletal: Negative for back pain, neck pain and neck stiffness.  Skin: Negative for rash.  Neurological: Positive for weakness. Negative for seizures, syncope, speech difficulty, light-headedness and headaches.     Physical Exam Updated Vital Signs BP (!) 134/96   Pulse 77   Temp 98.2 F (36.8 C) (Oral)   Resp (!) 21   SpO2 100%   Physical Exam  Constitutional: He is oriented to person, place, and time. He appears well-developed and well-nourished.  HENT:  Head: Normocephalic and atraumatic.  Eyes: Conjunctivae are normal. Right eye exhibits no discharge. Left eye exhibits no discharge.  Neck: Normal range of motion. Neck supple. No tracheal deviation present.  Cardiovascular: Normal rate and regular rhythm.  Pulmonary/Chest: Effort normal and breath sounds normal.  Abdominal: Soft. He exhibits no distension. There is no tenderness. There is no guarding.  Musculoskeletal: He exhibits no edema.  Neurological: He is alert and oriented to person, place, and time.  Reflex Scores:      Patellar reflexes are 2+ on the right side and 2+ on the left side.      Achilles reflexes are 2+ on the right side and 2+ on the left side. 5+ strength in UE and LE with f/e at major joints. Sensation to  palpation intact in UE and LE. CNs 2-12 grossly intact.  EOMFI.  PERRL.   Finger nose and coordination intact bilateral.   Visual fields intact to finger testing. No nystagmus   Skin: Skin is warm. No rash noted.  Psychiatric: He has a normal mood and affect.  Nursing note and vitals reviewed.    ED Treatments / Results  Labs (all labs ordered are listed, but only abnormal results are displayed) Labs Reviewed  BASIC METABOLIC PANEL - Abnormal; Notable for the following  components:      Result Value   Glucose, Bld 123 (*)    All other components within normal limits  URINALYSIS, ROUTINE W REFLEX MICROSCOPIC - Abnormal; Notable for the following components:   Ketones, ur 20 (*)    All other components within normal limits  CBC WITH DIFFERENTIAL/PLATELET  CK  TSH  MAGNESIUM  C-REACTIVE PROTEIN  ACETYLCHOLINE RECEPTOR AB, ALL  SEDIMENTATION RATE    EKG EKG Interpretation  Date/Time:  Friday February 18 2018 18:47:27 EDT Ventricular Rate:  71 PR Interval:    QRS Duration: 90 QT Interval:  367 QTC Calculation: 399 R Axis:   77 Text Interpretation:  Sinus rhythm Short PR interval Minimal ST depression, inferior leads Confirmed by Blane Ohara [phone]) on 02/18/2018 8:08:04 PM   Radiology Mr Brain W And Wo Contrast  Result Date: 02/18/2018 CLINICAL DATA:  Initial evaluation with acute difficulty swallowing. EXAM: MRI HEAD WITHOUT AND WITH CONTRAST TECHNIQUE: Multiplanar, multiecho pulse sequences of the brain and surrounding structures were obtained without and with intravenous contrast. CONTRAST:  20mL MULTIHANCE GADOBENATE DIMEGLUMINE 529 MG/ML IV SOLN COMPARISON:  None. FINDINGS: Brain: Cerebral volume within normal limits for age. No focal parenchymal signal abnormality identified. No abnormal foci of restricted diffusion to suggest acute or subacute ischemia. Gray-white matter differentiation maintained. No encephalomalacia to suggest chronic infarction. No evidence for acute intracranial hemorrhage. Single focus subcentimeter focus of susceptibility artifact noted within the periventricular white matter of the posterior left corona radiata (series 12, image 33), indeterminate, but of doubtful significance in isolation. No other evidence for chronic hemorrhage. No mass lesion, midline shift or mass effect. No hydrocephalus. No extra-axial fluid collection. Pituitary gland suprasellar region normal. No abnormal enhancement. Vascular: Major intracranial  vascular flow voids are well maintained. Skull and upper cervical spine: Craniocervical junction within normal limits. Upper cervical spine normal. Bone marrow signal intensity within normal limits. No scalp soft tissue abnormality. Sinuses/Orbits: Globes and orbital soft tissues within normal limits. Mild mucosal thickening within the inferior maxillary sinuses. Paranasal sinuses are otherwise clear. No mastoid effusion. Inner ear structures normal. Other: None. IMPRESSION: Normal brain MRI for age. No acute intracranial abnormality identified. Electronically Signed   By: Rise Mu M.D.   On: 02/18/2018 22:50    Procedures Procedures (including critical care time)  Medications Ordered in ED Medications  gadobenate dimeglumine (MULTIHANCE) injection 20 mL (20 mLs Intravenous Contrast Given 02/18/18 2227)     Initial Impression / Assessment and Plan / ED Course  I have reviewed the triage vital signs and the nursing notes.  Pertinent labs & imaging results that were available during my care of the patient were reviewed by me and considered in my medical decision making (see chart for details).     Patient presents with mild neurologic symptoms that he associates since using insecticide.  Patient has no focal neuro deficits on exam however with gradually worsening symptoms for 8 days plan for neurology consult. Discussed with poison control  who reviewed ingredients of insecticide spray and nothing that would cause organophosphate poisoning.  Updated the patient. Poison control has no further recommendations. Neurology evaluated recommended specific blood testing and close follow-up with outpatient neurology.  MRI performed no acute findings to explain patient's difficulty with swallowing and weakness.  Patient stable for close outpatient follow-up.  Blood work reviewed unremarkable.    Final Clinical Impressions(s) / ED Diagnoses   Final diagnoses:  Dysphagia, unspecified type   General weakness    ED Discharge Orders    None       Blane OharaZavitz, Levetta Bognar, MD 02/18/18 2318

## 2018-02-24 ENCOUNTER — Ambulatory Visit (INDEPENDENT_AMBULATORY_CARE_PROVIDER_SITE_OTHER): Payer: BLUE CROSS/BLUE SHIELD | Admitting: Neurology

## 2018-02-24 ENCOUNTER — Other Ambulatory Visit: Payer: Self-pay

## 2018-02-24 ENCOUNTER — Encounter: Payer: Self-pay | Admitting: Neurology

## 2018-02-24 VITALS — BP 116/76 | HR 86 | Ht 70.0 in | Wt 183.0 lb

## 2018-02-24 DIAGNOSIS — R200 Anesthesia of skin: Secondary | ICD-10-CM

## 2018-02-24 DIAGNOSIS — R202 Paresthesia of skin: Secondary | ICD-10-CM

## 2018-02-24 DIAGNOSIS — R1310 Dysphagia, unspecified: Secondary | ICD-10-CM | POA: Diagnosis not present

## 2018-02-24 DIAGNOSIS — F419 Anxiety disorder, unspecified: Secondary | ICD-10-CM | POA: Diagnosis not present

## 2018-02-24 DIAGNOSIS — R29898 Other symptoms and signs involving the musculoskeletal system: Secondary | ICD-10-CM | POA: Diagnosis not present

## 2018-02-24 DIAGNOSIS — M791 Myalgia, unspecified site: Secondary | ICD-10-CM

## 2018-02-24 NOTE — Progress Notes (Signed)
NEUROLOGY CONSULTATION NOTE  Collin Clark MRN: [id number] DOB: April 15, 1976  Referring provider: ED referral Primary care provider: Misty Stanley, PA-C  Reason for consult:  Dysphagia, weakness  HISTORY OF PRESENT ILLNESS: Collin Clark is a 42 year old right-handed male who presents for dysphagia and generalized weakness.  History supplemented by PCP and ED notes.  He has had trouble swallowing since 02/10/18.  At that time, he used most of the can of wasp spray.  Later that night he began experiencing some irritation in his throat.  The next day, he began experiencing difficulty swallowing foods such as nuts and crackers.  The next day, he began having intermittent difficulty swallowing, such as nuts, crackers, and chicken.  However, he was able to eat hamburger patties without difficulty.  He had no trouble with liquids.  It has gradually progressed since then.  He felt like the right side of his mouth was drooling and he noted some vague numbness on the right side of his face.  He began experiencing shortness of breath and increased anxiety.  He saw his PCP on 02/16/18 who prescribed a prednisone taper.  He presented to the ED at [facility] on 02/18/18 for further evaluation.  He then developed nausea, generalized fatigue and weakness and myalgias of the thighs and calves.  He would feel fluttering in his thighs and shoulders.  He began noticing some numbness and tingling in the back of his left thigh.  The bug spray mainly consisted of prallethrin and cybermethane.  After discussion with poison control, it was determined that nothing in the ingredients of the insecticide would cause organophosphate poisoning.  MRI of brain with and without contrast was performed, which was personally reviewed, and was normal.  Labs were unremarkable, including sed rate (3), CRP (<0.8), TSH (1.339), Mg (2.3), and CK (165).  CBC and BMP were unremarkable.  Acetylcholine receptor antibodies were ordered  but results still pending.  He started the 10 day steroid pack 7 days ago.  He notes improvement.  He has some continued myalgias and heaviness in the legs but no problems swallowing.  He denied headache, fever, upper respiratory infection, GI upset (other than nausea), slurred speech or double vision.  He is fit and works out in [employer] 3 to 4 days a week.  PAST MEDICAL HISTORY: No past medical history on file.  PAST SURGICAL HISTORY: Past Surgical History:  Procedure Laterality Date  . BRAIN SURGERY      MEDICATIONS: Current Outpatient Medications on File Prior to Visit  Medication Sig Dispense Refill  . Fexofenadine HCl (ALLEGRA PO) Take 1 tablet by mouth daily.    . Multiple Vitamin (MULTIVITAMIN WITH MINERALS) TABS tablet Take 1 tablet by mouth daily.    . Omega-3 Fatty Acids (FISH OIL PO) Take 1 tablet by mouth daily.     No current facility-administered medications on file prior to visit.     ALLERGIES: No Known Allergies  FAMILY HISTORY: Family History  Problem Relation Age of Onset  . Diabetes Mellitus II Mother   . Hypertension Brother     SOCIAL HISTORY: Social History   Socioeconomic History  . Marital status: Married    Spouse name: Not on file  . Number of children: 2  . Years of education: Not on file  . Highest education level: Professional school degree (e.g., MD, DDS, DVM, JD)  Occupational History  . Occupation: ATTORNEY    Employer: [employer]  Social Needs  .  Financial resource strain: Not on file  . Food insecurity:    Worry: Not on file    Inability: Not on file  . Transportation needs:    Medical: Not on file    Non-medical: Not on file  Tobacco Use  . Smoking status: Never Smoker  . Smokeless tobacco: Never Used  Substance and Sexual Activity  . Alcohol use: Yes    Alcohol/week: 0.6 oz    Types: 1 Glasses of wine per week    Comment: wine or beer  . Drug use: No  . Sexual activity: Not on file  Lifestyle  . Physical  activity:    Days per week: Not on file    Minutes per session: Not on file  . Stress: Not on file  Relationships  . Social connections:    Talks on phone: Not on file    Gets together: Not on file    Attends religious service: Not on file    Active member of club or organization: Not on file    Attends meetings of clubs or organizations: Not on file    Relationship status: Not on file  . Intimate partner violence:    Fear of current or ex partner: Not on file    Emotionally abused: Not on file    Physically abused: Not on file    Forced sexual activity: Not on file  Other Topics Concern  . Not on file  Social History Narrative   Patient is right-handed. He lives with his wife and 2 children in a 2 story house. He has been avoiding caffeine for the past 2 weeks. He has been unable to exercise recently.    REVIEW OF SYSTEMS: Constitutional: No fevers, chills, or sweats, no generalized fatigue, change in appetite Eyes: No visual changes, double vision, eye pain Ear, nose and throat: No hearing loss, ear pain, nasal congestion, sore throat Cardiovascular: No chest pain, palpitations Respiratory:  No shortness of breath at rest or with exertion, wheezes GastrointestinaI: No nausea, vomiting, diarrhea, abdominal pain, fecal incontinence Genitourinary:  No dysuria, urinary retention or frequency Musculoskeletal:  No neck pain, back pain Integumentary: No rash, pruritus, skin lesions Neurological: as above Psychiatric: No depression, insomnia, anxiety Endocrine: No palpitations, fatigue, diaphoresis, mood swings, change in appetite, change in weight, increased thirst Hematologic/Lymphatic:  No purpura, petechiae. Allergic/Immunologic: no itchy/runny eyes, nasal congestion, recent allergic reactions, rashes  PHYSICAL EXAM: Vitals:   02/24/18 0818  BP: 116/76  Pulse: 86  SpO2: 97%   General: No acute distress.  Patient appears well-groomed.  Head:   Normocephalic/atraumatic Eyes:  fundi examined but not visualized Neck: supple, no paraspinal tenderness, full range of motion Back: No paraspinal tenderness Heart: regular rate and rhythm Lungs: Clear to auscultation bilaterally. Vascular: No carotid bruits. Neurological Exam: Mental status: alert and oriented to person, place, and time, recent and remote memory intact, fund of knowledge intact, attention and concentration intact, speech fluent and not dysarthric, language intact. Cranial nerves: CN I: not tested CN II: pupils equal, round and reactive to light, visual fields intact CN III, IV, VI:  full range of motion, no nystagmus, no ptosis CN V: facial sensation intact CN VII: upper and lower face symmetric CN VIII: hearing intact CN IX, X: gag intact, uvula midline CN XI: sternocleidomastoid and trapezius muscles intact CN XII: tongue midline Bulk & Tone: normal, no fasciculations. Motor:  5/5 throughout  Sensation:  pinprick and vibration sensation intact. Deep Tendon Reflexes:  2+ throughout, toes downgoing.  Finger to nose testing:  Without dysmetria.  Heel to shin:  Without dysmetria.  Gait:  Normal station and stride.  Able to turn and tandem walk. Romberg negative.  IMPRESSION: 1.  Dysphagia 2.  Bilateral leg weakness 3.  Left leg numbness (possibly S1 radiculopathy) 4.  Fatigue 5.  Anxiety  From a neurologic standpoint, dysphagia, shortness of breath and bilateral leg weakness would be concerning for a neuromuscular disorder such as myopathy, disease at the neuromuscular junction or motor neuron disease.  However, suspicion is low based on acute onset and associated symptoms that do not correlate with such conditions.  A reaction to the insecticide may be possible.     PLAN: 1.  We will check NCV-EMG to evaluate for myopathy, neuromuscular junction disease or motor neuron disease. 2.  Further recommendations pending results.  However, I suspect if testing is  negative, then no further neurologic workup would be warranted.  Thank you for allowing me to take part in the care of this patient.  Shon Millet, DO  CC:  Misty Stanley, PA-C

## 2018-02-24 NOTE — Patient Instructions (Signed)
1.  We will check NCV-EMG to evaluate the nerves and muscles. 2.  Further recommendations pending results

## 2018-02-25 LAB — ACETYLCHOLINE RECEPTOR AB, ALL
Acety choline binding ab: <0.03 nmol/L (ref 0.00–0.24)
Acetylchol Block Ab: 12 % (ref 0–25)
Acetylcholine Modulat Ab: <12 % (ref 0–20)

## 2018-03-08 ENCOUNTER — Telehealth: Payer: Self-pay | Admitting: Neurology

## 2018-03-10 ENCOUNTER — Encounter: Payer: BLUE CROSS/BLUE SHIELD | Admitting: Neurology

## 2018-03-10 ENCOUNTER — Encounter

## 2018-03-15 ENCOUNTER — Encounter: Payer: BLUE CROSS/BLUE SHIELD | Admitting: Neurology

## 2018-03-15 ENCOUNTER — Telehealth: Payer: Self-pay | Admitting: Neurology

## 2018-03-15 NOTE — Telephone Encounter (Signed)
Patient lmom needing to confirm proper paperwork with Insurance? Please Call. Thanks

## 2018-03-17 ENCOUNTER — Encounter: Payer: BLUE CROSS/BLUE SHIELD | Admitting: Neurology

## 2018-03-17 ENCOUNTER — Ambulatory Visit (INDEPENDENT_AMBULATORY_CARE_PROVIDER_SITE_OTHER): Payer: BLUE CROSS/BLUE SHIELD | Admitting: Neurology

## 2018-03-17 ENCOUNTER — Telehealth: Payer: Self-pay | Admitting: Neurology

## 2018-03-17 ENCOUNTER — Encounter

## 2018-03-17 DIAGNOSIS — R29898 Other symptoms and signs involving the musculoskeletal system: Secondary | ICD-10-CM | POA: Diagnosis not present

## 2018-03-17 DIAGNOSIS — R202 Paresthesia of skin: Principal | ICD-10-CM

## 2018-03-17 DIAGNOSIS — R200 Anesthesia of skin: Secondary | ICD-10-CM

## 2018-03-17 DIAGNOSIS — M791 Myalgia, unspecified site: Secondary | ICD-10-CM

## 2018-03-17 NOTE — Telephone Encounter (Signed)
Called and spoke with Pt. He was here earlier today for an EMG. Dr Allena KatzPatel advised him the results would be available in 24 hours, at the latest Monday. Pt is extremely anxious. He wants our office to odrer an MRI on his lumbar spine. I advised him, we need to wait for EMG results before any further testing.

## 2018-03-17 NOTE — Telephone Encounter (Signed)
Spoke witgh Pt about EMG. I advised him anything we do in the office should be treated like an OV, and does not require PA

## 2018-03-17 NOTE — Procedures (Signed)
[facility] Neurology  [address], [address]  [place], [place] [address] Tel: [phone] Fax:  [phone] Test Date:  03/17/2018  Patient: Collin Clark DOB: [birth year] Physician: Nita Sickle, DO  Sex: Male Height: 5\' 10"  Ref Phys: Shon Millet, DO  ID#: [id number] Temp: 32.5C Technician:    Patient Complaints: This is a 42 year old man referred for evaluation of dysphasia, generalized weakness, and paresthesia.  NCV & EMG Findings: Extensive electrodiagnostic testing of the left upper and lower extremities along with repetitive nerve stimulation shows:  1. All sensory responses are within normal limits including the left median, ulnar, mixed Palmer, sural, and superficial peroneal nerves. 2. All motor responses are within normal limits including the left median, ulnar, peroneal, and tibial nerves. Of note, there is evidence of a left accessory peroneal nerve, a normal variant. 3. Repetitive nerve stimulation of the spinal accessory, median, and peroneal nerves recording at the trapezius, abductor pollicis brevis, and tibialis anterior muscles, respectively, is within normal limits.  4. Left tibial H reflex study is within normal limits.  5. There is no evidence of active or chronic motor axon loss changes affecting any of the tested muscles. Motor unit configuration and recruitment pattern is within normal limits.    Impression: This is a normal study of the left side. In particular, there is no evidence of a diffuse myopathy, neuromuscular junction disorder, disorder of anterior horn cells, or sensorimotor polyneuropathy.   ___________________________ Nita Sickle, DO    Nerve Conduction Studies Anti Sensory Summary Table   Site NR Peak (ms) Norm Peak (ms) P-T Amp (V) Norm P-T Amp  Left Median Anti Sensory (2nd Digit)  Wrist    3.4 <3.4 47.4 >20  Left Sup Peroneal Anti Sensory (Ant Lat Mall)  12 cm    3.4 <4.5 8.2 >5  Left Sural Anti Sensory (Lat Mall)  Calf    3.6  <4.5 19.1 >5  Left Ulnar Anti Sensory (5th Digit)  Wrist    3.1 <3.1 24.0 >12   Motor Summary Table   Site NR Onset (ms) Norm Onset (ms) O-P Amp (mV) Norm O-P Amp Site1 Site2 Delta-0 (ms) Dist (cm) Vel (m/s) Norm Vel (m/s)  Left Median Motor (Abd Poll Brev)  Wrist    3.0 <3.9 8.1 >6 Elbow Wrist 5.1 30.0 59 >50  Elbow    8.1  8.1         Left Peroneal Motor (Ext Dig Brev)  Ankle    4.6 <5.5 4.6 >3 B Fib Ankle 7.9 40.0 51 >40  B Fib    12.5  5.4  Poplt B Fib 1.6 9.0 56 >40  Poplt    14.1  5.4         Medial malleolus    4.5  3.8         Left Peroneal TA Motor (Tib Ant)  Fib Head    2.0 <4.0 4.4 >4 Poplit Fib Head 1.7 9.0 53 >40  Poplit    3.7  4.3         Left Tibial Motor (Abd Hall Brev)  Ankle    3.6 <6.0 8.5 >8 Knee Ankle 8.4 42.0 50 >40  Knee    12.0  7.7         Left Ulnar Motor (Abd Dig Minimi)  Wrist    2.3 <3.1 10.0 >7 B Elbow Wrist 4.8 27.0 56 >50  B Elbow    7.1  9.3  A Elbow B Elbow 1.8 10.0 56 >  50  A Elbow    8.9  8.9          Comparison Summary Table   Site NR Peak (ms) Norm Peak (ms) P-T Amp (V) Site1 Site2 Delta-P (ms) Norm Delta (ms)  Left Median/Ulnar Palm Comparison (Wrist - 8cm)  Median Palm    1.9 <2.2 88.6 Median Palm Ulnar Palm 0.1   Ulnar Palm    2.0 <2.2 24.2       H Reflex Studies   NR H-Lat (ms) Lat Norm (ms) L-R H-Lat (ms)  Left Tibial (Gastroc)     32.11 <35    EMG   Side Muscle Ins Act Fibs Psw Fasc Number Recrt Dur Dur. Amp Amp. Poly Poly. Comment  Left AntTibialis Nml Nml Nml Nml Nml Nml Nml Nml Nml Nml Nml Nml N/A  Left Gastroc Nml Nml Nml Nml Nml Nml Nml Nml Nml Nml Nml Nml N/A  Left RectFemoris Nml Nml Nml Nml Nml Nml Nml Nml Nml Nml Nml Nml N/A  Left GluteusMed Nml Nml Nml Nml Nml Nml Nml Nml Nml Nml Nml Nml N/A  Left Lumbo Parasp Low Nml Nml Nml Nml Nml Nml Nml Nml Nml Nml Nml Nml N/A  Left Iliacus Nml Nml Nml Nml Nml Nml Nml Nml Nml Nml Nml Nml N/A  Left 1stDorInt Nml Nml Nml Nml Nml Nml Nml Nml Nml Nml Nml Nml N/A  Left  PronatorTeres Nml Nml Nml Nml Nml Nml Nml Nml Nml Nml Nml Nml N/A  Left Biceps Nml Nml Nml Nml Nml Nml Nml Nml Nml Nml Nml Nml N/A  Left Triceps Nml Nml Nml Nml Nml Nml Nml Nml Nml Nml Nml Nml N/A  Left Deltoid Nml Nml Nml Nml Nml Nml Nml Nml Nml Nml Nml Nml N/A  Left Cervical Parasp Low Nml Nml Nml Nml Nml Nml Nml Nml Nml Nml Nml Nml N/A   RNS   Trial # Label Amp 1 (mV)  O-P Amp 5 (mV)  O-P Amp % Dif Area 1 (mVms) Area 5 (mVms) Area % Dif Rep Rate Train Length Pause Time (min:sec) Comments  Left Abd Poll Brev  Tr 1 Baseline 9.79 10.10 3.2 39.57 37.91 -4.2 3.00 10 00:30   Tr 2 Post Exercise 10.12 10.25 1.3 39.71 38.54 -3.0 3.00 10 01:00   Tr 3 1 Min Post 10.35 10.45 1.0 40.63 38.96 -4.1 3.00 10 01:00   Tr 4 2 Min Post 10.44 10.69 2.4 40.46 38.39 -5.1 3.00 10 01:00   Tr 5 3 Min Post 10.64 10.77 1.2 40.67 38.53 -5.3 3.00 10 00:00   Left Trapezius  Tr 1 Baseline 5.94 5.99 0.8 51.57 45.41 -10.9 3.00 10 00:30   Tr 2 Post Exercise 6.58 6.42 -2.4 55.27 50.30 -9.0 3.00 10 01:00   Tr 3 1 Min Post 7.00 6.63 -5.4 57.05 51.76 -9.3 3.00 10 01:00   Tr 4 2 Min Post 7.30 6.99 -4.2 58.58 53.54 -8.6 3.00 10 01:00   Tr 5 3 Min Post 7.30 7.21 -1.3 56.16 51.98 -7.4 3.00 10 00:00   Left AntTibialis  Tr 1 Baseline 4.35 4.28 -1.6 23.89 22.64 -5.2 3.00 10 00:30   Tr 2 Post Exercise 4.58 4.43 -3.1 24.58 22.98 -6.5 3.00 10 01:00   Tr 3 1 Min Post 4.59 4.54 -1.2 25.17 24.35 -3.3 3.00 10 01:00   Tr 4 2 Min Post 4.44 4.37 -1.6 24.60 23.65 -3.8 3.00 10 01:00   Tr 5 3 Min Post 4.36 4.27 -2.2 24.22 22.90 -5.4 3.00 10  00:00              Waveforms:

## 2018-03-17 NOTE — Telephone Encounter (Signed)
Patient wants to talk to someone about some new things going on with him. He would also like the results of the EMG when they are ready. He would like to talk to someone today

## 2018-03-18 ENCOUNTER — Telehealth: Payer: Self-pay

## 2018-03-18 ENCOUNTER — Telehealth: Payer: Self-pay | Admitting: Neurology

## 2018-03-18 NOTE — Telephone Encounter (Signed)
Called and spoke with Pt. Advised of EMG results. Pt would like to discuss EMG results he was able to see on My Chart. He forgot to mention pain in the top of his left foot, and saw something in the result concerning a left nerve. He also would like to have an MRI. I advised him we could add him to a wait list. I placed him on hold, when I returned he was not on the line.

## 2018-03-18 NOTE — Telephone Encounter (Signed)
Patient called wanting to talk about possibly getting an MRI done on his lower back for the numbness of his leg. Please call him at [phone]. Thanks!

## 2018-03-18 NOTE — Telephone Encounter (Signed)
-----   Message from Drema DallasAdam R Jaffe, DO sent at 03/18/2018  9:02 AM EDT ----- NCV-EMG is normal.  At this point, I don't think there is any indication for further neurologic testing.  If he has further questions or concerns, he may make a follow up appointment to discuss.

## 2018-03-18 NOTE — Telephone Encounter (Signed)
See telephone encounter associated with EMG results

## 2018-03-18 NOTE — Progress Notes (Signed)
NEUROLOGY FOLLOW UP OFFICE NOTE  Collin QuayCarlos Clark [id number]  HISTORY OF PRESENT ILLNESS: Collin Clark is a 42 year old right-handed male who follows up for multiple symptoms including dysphagia, generalized weakness and numbness.    UPDATE: NCV-EMG from 03/17/18 was normal. Since last visit, he has been overall feeling better.  Swallowing has improved but he still feels like something tiny like a grain of rice is caught in his throat.  Sporadically, he gets weakness and pain in the legs.    HISTORY: He has had trouble swallowing since 02/10/18.  At that time, he used most of the can of wasp spray.  Later that night he began experiencing some irritation in his throat.  The next day, he began experiencing difficulty swallowing foods such as nuts and crackers.  The next day, he began having intermittent difficulty swallowing, such as nuts, crackers, and chicken.  However, he was able to eat hamburger patties without difficulty.  He had no trouble with liquids.  It has gradually progressed since then.  He felt like the right side of his mouth was drooling and he noted some vague numbness on the right side of his face.  He began experiencing shortness of breath and increased anxiety.  He saw his PCP on 02/16/18 who prescribed a prednisone taper.  [facility] for further evaluation.  He then developed nausea, generalized fatigue and weakness and myalgias of the thighs and calves.  He would feel fluttering in his thighs and shoulders.  He began noticing some numbness and tingling in the back of his left thigh.  The bug spray mainly consisted of prallethrin and cybermethane.  After discussion with poison control, it was determined that nothing in the ingredients of the insecticide would cause organophosphate poisoning.  MRI of brain with and without contrast was performed, which was personally reviewed, and was normal.  Labs were unremarkable, including sed rate  (3), CRP (<0.8), TSH (1.339), Mg (2.3), and CK (165).  CBC and BMP were unremarkable.  Acetylcholine receptor antibodies were ordered but results still pending.  He started the 10 day steroid pack 7 days ago.  He notes improvement.  He has some continued myalgias and heaviness in the legs but no problems swallowing.  He denied headache, fever, upper respiratory infection, GI upset (other than nausea), slurred speech or double vision.  He is fit and works out in [employer] gym 3 to 4 days a week.  PAST MEDICAL HISTORY: No past medical history on file.  MEDICATIONS: Current Outpatient Medications on File Prior to Visit  Medication Sig Dispense Refill  . Fexofenadine HCl (ALLEGRA PO) Take 1 tablet by mouth daily.    . fluticasone (FLONASE) 50 MCG/ACT nasal spray USE 1 SPRAY(S) IN EACH NOSTRIL ONCE DAILY  12  . Multiple Vitamin (MULTIVITAMIN WITH MINERALS) TABS tablet Take 1 tablet by mouth daily.    . Omega-3 Fatty Acids (FISH OIL PO) Take 1 tablet by mouth daily.    . predniSONE (DELTASONE) 20 MG tablet TAKE 1 TABLET BY MOUTH ONCE DAILY FOR 10 DAYS  0   No current facility-administered medications on file prior to visit.     ALLERGIES: No Known Allergies  FAMILY HISTORY: Family History  Problem Relation Age of Onset  . Diabetes Mellitus II Mother   . Hypertension Brother     SOCIAL HISTORY: Social History   Socioeconomic History  . Marital status: Married    Spouse name: Not on  file  . Number of children: 2  . Years of education: Not on file  . Highest education level: Professional school degree (e.g., MD, DDS, DVM, JD)  Occupational History  . Occupation: ATTORNEY    Employer: [employer]  Social Needs  . Financial resource strain: Not on file  . Food insecurity:    Worry: Not on file    Inability: Not on file  . Transportation needs:    Medical: Not on file    Non-medical: Not on file  Tobacco Use  . Smoking status: Never Smoker  . Smokeless tobacco: Never Used   Substance and Sexual Activity  . Alcohol use: Yes    Alcohol/week: 1.0 standard drinks    Types: 1 Glasses of wine per week    Comment: wine or beer  . Drug use: No  . Sexual activity: Not on file  Lifestyle  . Physical activity:    Days per week: Not on file    Minutes per session: Not on file  . Stress: Not on file  Relationships  . Social connections:    Talks on phone: Not on file    Gets together: Not on file    Attends religious service: Not on file    Active member of club or organization: Not on file    Attends meetings of clubs or organizations: Not on file    Relationship status: Not on file  . Intimate partner violence:    Fear of current or ex partner: Not on file    Emotionally abused: Not on file    Physically abused: Not on file    Forced sexual activity: Not on file  Other Topics Concern  . Not on file  Social History Narrative   Patient is right-handed. He lives with his wife and 2 children in a 2 story house. He has been avoiding caffeine for the past 2 weeks. He has been unable to exercise recently.    REVIEW OF SYSTEMS: Constitutional: No fevers, chills, or sweats, no generalized fatigue, change in appetite Eyes: No visual changes, double vision, eye pain Ear, nose and throat: No hearing loss, ear pain, nasal congestion, sore throat Cardiovascular: No chest pain, palpitations Respiratory:  No shortness of breath at rest or with exertion, wheezes GastrointestinaI: No nausea, vomiting, diarrhea, abdominal pain, fecal incontinence Genitourinary:  No dysuria, urinary retention or frequency Musculoskeletal:  No neck pain, back pain Integumentary: No rash, pruritus, skin lesions Neurological: as above Psychiatric: No depression, insomnia, anxiety Endocrine: No palpitations, fatigue, diaphoresis, mood swings, change in appetite, change in weight, increased thirst Hematologic/Lymphatic:  No purpura, petechiae. Allergic/Immunologic: no itchy/runny eyes, nasal  congestion, recent allergic reactions, rashes  PHYSICAL EXAM: Blood pressure 108/82, pulse 74, height 5\' 10"  (1.778 m), weight 177 lb (80.3 kg), SpO2 98 %.  IMPRESSION: 1.  Dysphagia 2.  Weakness 3.  Left leg numbness 4.  Fatigue 5.  Anxiety  There is no evidence of demyelinating disease, neurodegenerative disease, neuropathy, radiculopathy, myopathy or neuromuscular junction disorder.  MRI does not reveal CNS etiology.  I explained to him that based on the testing and his neurologic exam that I do not think his symptoms are neurologic.  25 minutes spent face to face with patient, 100% spent discussing findings and assessment.  Shon Millet, DO  CC:  Misty Stanley, PA-C

## 2018-03-18 NOTE — Telephone Encounter (Signed)
Patient called back wanting you to know he has also been experiencing some pain on the top of his foot. Thanks.

## 2018-03-21 ENCOUNTER — Encounter: Payer: Self-pay | Admitting: Neurology

## 2018-03-21 ENCOUNTER — Ambulatory Visit (INDEPENDENT_AMBULATORY_CARE_PROVIDER_SITE_OTHER): Payer: BLUE CROSS/BLUE SHIELD | Admitting: Neurology

## 2018-03-21 VITALS — BP 108/82 | HR 74 | Ht 70.0 in | Wt 177.0 lb

## 2018-03-21 DIAGNOSIS — R29898 Other symptoms and signs involving the musculoskeletal system: Secondary | ICD-10-CM

## 2018-03-21 DIAGNOSIS — R202 Paresthesia of skin: Secondary | ICD-10-CM

## 2018-03-21 DIAGNOSIS — R1310 Dysphagia, unspecified: Secondary | ICD-10-CM | POA: Diagnosis not present

## 2018-03-21 DIAGNOSIS — R200 Anesthesia of skin: Secondary | ICD-10-CM

## 2018-03-21 DIAGNOSIS — M791 Myalgia, unspecified site: Secondary | ICD-10-CM

## 2018-04-07 ENCOUNTER — Other Ambulatory Visit: Payer: Self-pay | Admitting: Orthopedic Surgery

## 2018-04-07 DIAGNOSIS — M5414 Radiculopathy, thoracic region: Secondary | ICD-10-CM

## 2018-04-07 DIAGNOSIS — G959 Disease of spinal cord, unspecified: Secondary | ICD-10-CM

## 2018-04-20 ENCOUNTER — Other Ambulatory Visit: Payer: BLUE CROSS/BLUE SHIELD

## 2018-05-04 ENCOUNTER — Ambulatory Visit
Admission: RE | Admit: 2018-05-04 | Discharge: 2018-05-04 | Disposition: A | Discharge status: Home / Self Care | Payer: BLUE CROSS/BLUE SHIELD | Source: Ambulatory Visit | Attending: Orthopedic Surgery | Admitting: Orthopedic Surgery

## 2018-05-04 DIAGNOSIS — M5414 Radiculopathy, thoracic region: Secondary | ICD-10-CM

## 2018-05-04 DIAGNOSIS — G959 Disease of spinal cord, unspecified: Secondary | ICD-10-CM

## 2018-06-24 ENCOUNTER — Ambulatory Visit: Payer: BLUE CROSS/BLUE SHIELD | Admitting: Neurology

## 2018-06-24 ENCOUNTER — Encounter

## 2020-03-26 IMAGING — MR MR HEAD WO/W CM
11 of 13 series · 40 of 48 positions shown · IV contrast (multihance)
Comparison: None.

CLINICAL DATA: Initial evaluation with acute difficulty swallowing.

EXAM:
MRI HEAD WITHOUT AND WITH CONTRAST
TECHNIQUE: Multiplanar, multiecho pulse sequences of the brain and surrounding
structures were obtained without and with intravenous contrast.
CONTRAST:  20mL MULTIHANCE GADOBENATE DIMEGLUMINE 529 MG/ML IV SOLN

[Series 5: ax dwi_tracew · axial · 3.0mm · 1.50mm/px · z∈[+14,+166]mm · 8 of 84 slices shown]
[im 1/84]
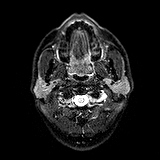
[im 12/84]
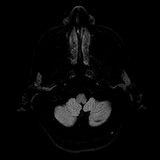
[im 24/84]
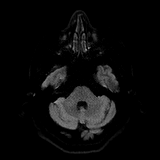
[im 36/84]
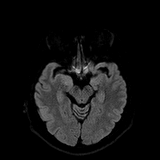
[im 48/84]
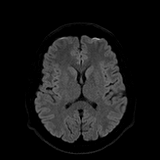
[im 60/84]
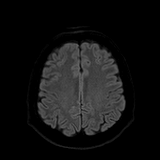
[im 72/84]
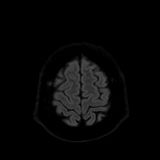
[im 84/84]
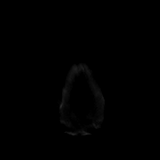

[Series 6: ax dwi_adc · axial · 3.0mm · 1.50mm/px · z∈[+14,+166]mm · 5 of 42 slices shown]
[im 1/42]
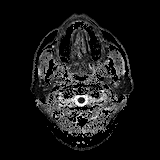
[im 11/42]
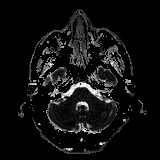
[im 21/42]
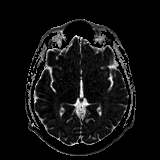
[im 31/42]
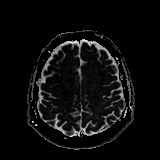
[im 42/42]
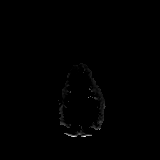

[Series 7: cor dwi_tracew · coronal · 5.0mm · 1.44mm/px · 6 of 70 slices shown]
[im 1/70]
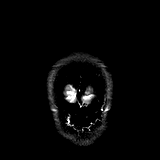
[im 14/70]
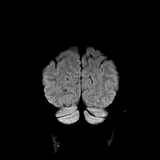
[im 28/70]
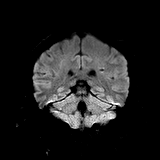
[im 42/70]
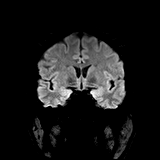
[im 56/70]
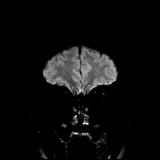
[im 70/70]
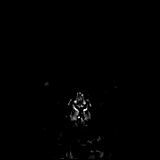

[Series 8: cor dwi_adc · coronal · 5.0mm · 1.44mm/px · 3 of 35 slices shown]
[im 1/35]
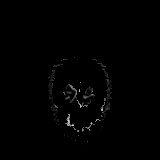
[im 18/35]
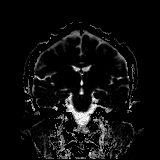
[im 35/35]
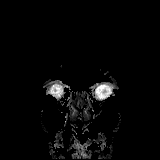

[Series 9: T1 · sagittal · 5.0mm · 0.78mm/px · 2 of 25 slices shown]
[im 1/25]
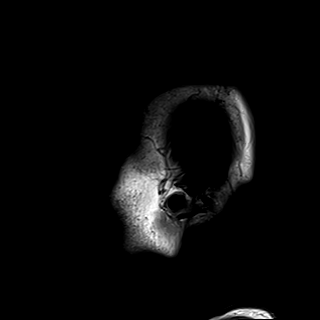
[im 25/25]
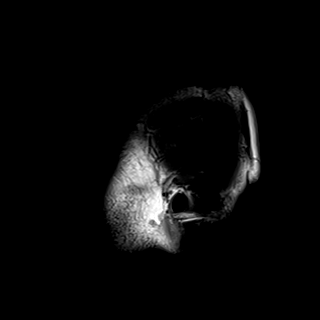

[Series 10: T2 · axial · 5.0mm · 0.72mm/px · z∈[+25,+165]mm · 2 of 25 slices shown]
[im 1/25]
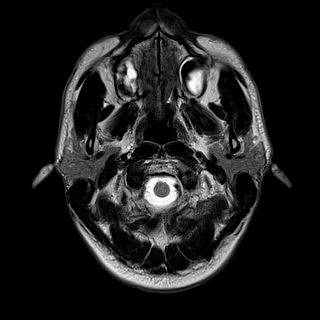
[im 25/25]
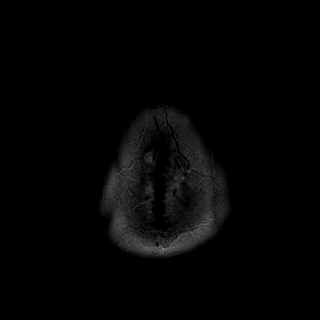

[Series 11: FLAIR · axial · 5.0mm · 0.45mm/px · z∈[+23,+164]mm · 2 of 25 slices shown]
[im 1/25]
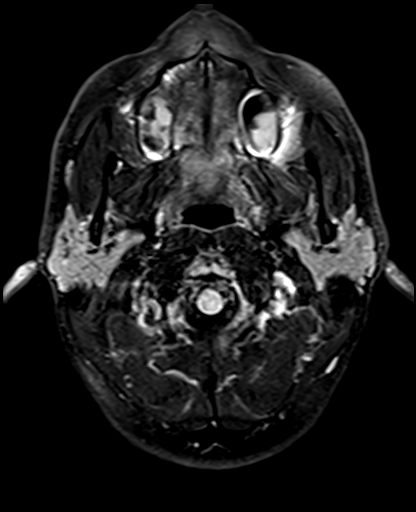
[im 25/25]
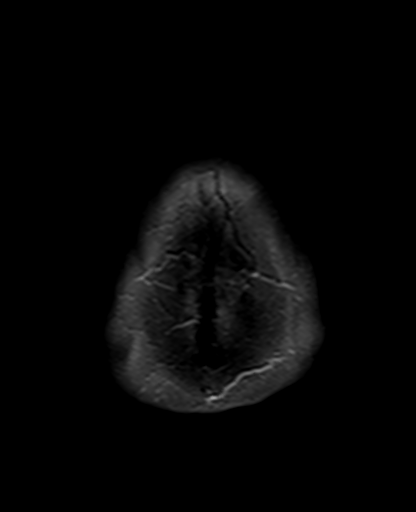

[Series 12: swi_images · axial · 3.0mm · 0.90mm/px · z∈[+20,+169]mm · 4 of 52 slices shown]
[im 1/52]
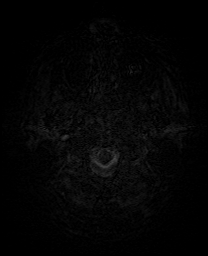
[im 18/52]
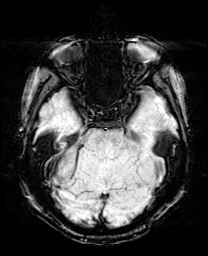
[im 35/52]
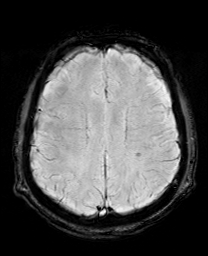
[im 52/52]
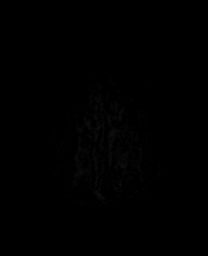

[Series 13: mip_images(sw) · axial · 24.0mm · 0.90mm/px · z∈[+30,+159]mm · 4 of 45 slices shown]
[im 1/45]
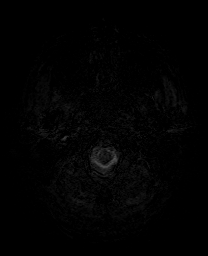
[im 15/45]
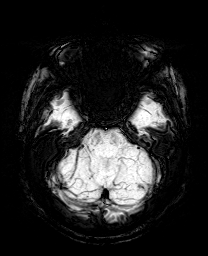
[im 30/45]
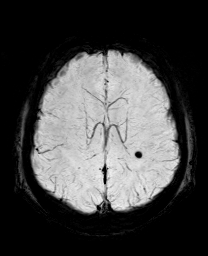
[im 45/45]
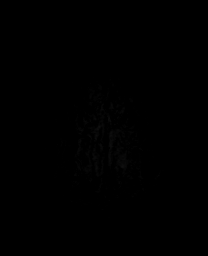

[Series 15: T2 post-contrast · coronal · 5.0mm · 0.72mm/px · 2 of 28 slices shown]
[im 1/28]
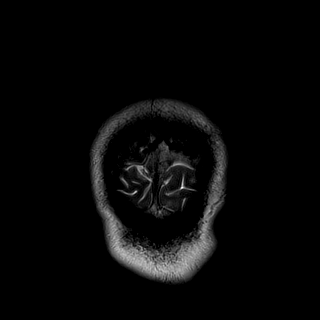
[im 28/28]
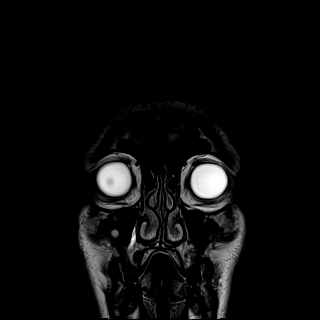

[Series 17: T1 post-contrast · coronal · 5.0mm · 0.34mm/px · 2 of 28 slices shown]
[im 1/28]
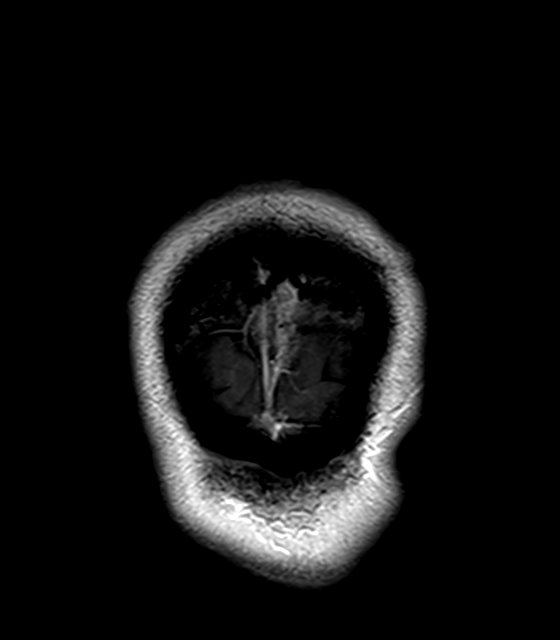
[im 28/28]
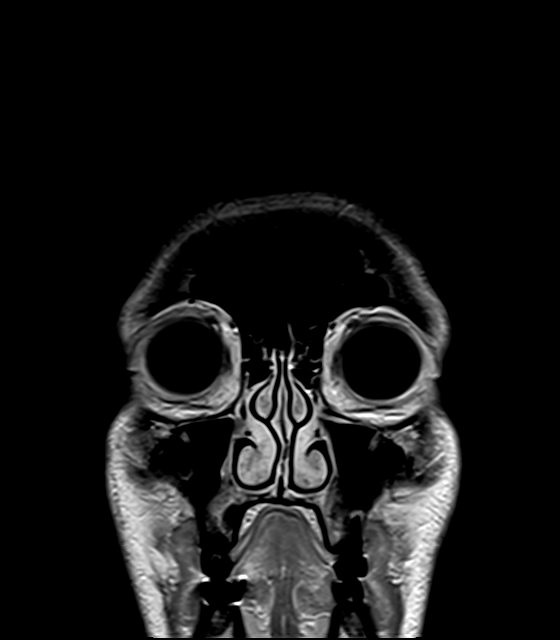

[40 of 48 positions shown; findings below may reference images not displayed]

FINDINGS: Brain: Cerebral volume within normal limits for age. No focal
parenchymal signal abnormality identified. No abnormal foci of
restricted diffusion to suggest acute or subacute ischemia.
Gray-white matter differentiation maintained. No encephalomalacia to
suggest chronic infarction. No evidence for acute intracranial
hemorrhage. Single focus subcentimeter focus of susceptibility
artifact noted within the periventricular white matter of the
posterior left corona radiata (series 12, image 33), indeterminate,
but of doubtful significance in isolation. No other evidence for
chronic hemorrhage.

No mass lesion, midline shift or mass effect. No hydrocephalus. No
extra-axial fluid collection. Pituitary gland suprasellar region
normal.

No abnormal enhancement.

Vascular: Major intracranial vascular flow voids are well
maintained.

Skull and upper cervical spine: Craniocervical junction within
normal limits. Upper cervical spine normal. Bone marrow signal
intensity within normal limits. No scalp soft tissue abnormality.

Sinuses/Orbits: Globes and orbital soft tissues within normal
limits. Mild mucosal thickening within the inferior maxillary
sinuses. Paranasal sinuses are otherwise clear. No mastoid effusion.
Inner ear structures normal.

Other: None.
IMPRESSION: Normal brain MRI for age. No acute intracranial abnormality
identified.

## 2023-05-03 ENCOUNTER — Ambulatory Visit (HOSPITAL_BASED_OUTPATIENT_CLINIC_OR_DEPARTMENT_OTHER)
Admission: RE | Admit: 2023-05-03 | Discharge: 2023-05-03 | Disposition: A | Discharge status: Home / Self Care | Payer: BC Managed Care – PPO | Source: Ambulatory Visit | Attending: Vascular Surgery | Admitting: Vascular Surgery

## 2023-05-03 ENCOUNTER — Other Ambulatory Visit: Payer: Self-pay

## 2023-05-03 ENCOUNTER — Encounter (HOSPITAL_COMMUNITY): Payer: Self-pay | Admitting: *Deleted

## 2023-05-03 ENCOUNTER — Other Ambulatory Visit (HOSPITAL_COMMUNITY): Payer: Self-pay

## 2023-05-03 ENCOUNTER — Emergency Department (HOSPITAL_COMMUNITY)
Admission: EM | Admit: 2023-05-03 | Discharge: 2023-05-03 | Disposition: A | Discharge status: Home / Self Care | Payer: BC Managed Care – PPO | Attending: Emergency Medicine | Admitting: Emergency Medicine

## 2023-05-03 ENCOUNTER — Ambulatory Visit (HOSPITAL_COMMUNITY)
Admission: RE | Admit: 2023-05-03 | Discharge: 2023-05-03 | Disposition: A | Discharge status: Home / Self Care | Payer: BC Managed Care – PPO | Source: Ambulatory Visit | Attending: Emergency Medicine | Admitting: Emergency Medicine

## 2023-05-03 ENCOUNTER — Encounter (HOSPITAL_COMMUNITY): Payer: Self-pay

## 2023-05-03 VITALS — BP 114/75 | HR 74

## 2023-05-03 DIAGNOSIS — I82431 Acute embolism and thrombosis of right popliteal vein: Secondary | ICD-10-CM | POA: Diagnosis present

## 2023-05-03 DIAGNOSIS — M7989 Other specified soft tissue disorders: Secondary | ICD-10-CM | POA: Diagnosis not present

## 2023-05-03 DIAGNOSIS — M79604 Pain in right leg: Secondary | ICD-10-CM | POA: Diagnosis present

## 2023-05-03 DIAGNOSIS — R2241 Localized swelling, mass and lump, right lower limb: Secondary | ICD-10-CM | POA: Insufficient documentation

## 2023-05-03 LAB — CBC
HCT: 41.5 % (ref 39.0–52.0)
Hemoglobin: 14.1 g/dL (ref 13.0–17.0)
MCH: 31.5 pg (ref 26.0–34.0)
MCHC: 34 g/dL (ref 30.0–36.0)
MCV: 92.6 fL (ref 80.0–100.0)
Platelets: 249 10*3/uL (ref 150–400)
RBC: 4.48 MIL/uL (ref 4.22–5.81)
RDW: 12.1 % (ref 11.5–15.5)
WBC: 7.8 10*3/uL (ref 4.0–10.5)
nRBC: 0 % (ref 0.0–0.2)

## 2023-05-03 LAB — I-STAT CHEM 8, ED
BUN: 24 mg/dL — ABNORMAL HIGH (ref 6–20)
Calcium, Ion: 1.18 mmol/L (ref 1.15–1.40)
Chloride: 106 mmol/L (ref 98–111)
Creatinine, Ser: 0.9 mg/dL (ref 0.61–1.24)
Glucose, Bld: 106 mg/dL — ABNORMAL HIGH (ref 70–99)
HCT: 40 % (ref 39.0–52.0)
Hemoglobin: 13.6 g/dL (ref 13.0–17.0)
Potassium: 4.1 mmol/L (ref 3.5–5.1)
Sodium: 139 mmol/L (ref 135–145)
TCO2: 21 mmol/L — ABNORMAL LOW (ref 22–32)

## 2023-05-03 MED ORDER — ENOXAPARIN SODIUM 100 MG/ML IJ SOSY
1.0000 mg/kg | PREFILLED_SYRINGE | Freq: Two times a day (BID) | INTRAMUSCULAR | Status: DC
  Administered 2023-05-03: 85 mg via SUBCUTANEOUS
  Filled 2023-05-03: qty 1

## 2023-05-03 MED ORDER — APIXABAN 5 MG PO TABS
5.0000 mg | ORAL_TABLET | Freq: Two times a day (BID) | ORAL | 5 refills | Status: AC
  Filled 2023-05-03 – 2023-05-20 (×2): qty 60, 30d supply, fill #0
  Filled 2023-06-28: qty 60, 30d supply, fill #1
  Filled 2023-07-28: qty 60, 30d supply, fill #2
  Filled 2023-08-26: qty 60, 30d supply, fill #0
  Filled 2023-09-23: qty 60, 30d supply, fill #1

## 2023-05-03 MED ORDER — APIXABAN (ELIQUIS) VTE STARTER PACK (10MG AND 5MG)
ORAL_TABLET | ORAL | 0 refills | Status: AC
  Filled 2023-05-03: qty 74, 30d supply, fill #0

## 2023-05-03 NOTE — ED Provider Notes (Signed)
[facility] EMERGENCY DEPARTMENT AT [facility] Provider Note   CSN: [id number] Arrival date & time: 05/03/23  0413     History  Chief Complaint  Patient presents with   Leg Pain    Collin Clark is a 47 y.o. male.  47 y/o male presents to the ED for evaluation of pain to the RLE. He reports a cramping sensation in his right calf before bed almost a week ago. Describes the sensation as a tightening, like a "charlie horse". Symptoms have continued to recur and patient has noticed associated swelling of the RLE with some mild erythema. Denies associated trauma as well as hemoptysis, CP, syncope, SOB. No PMH of VTE. No prolonged travel, recent hospitalizations.   Leg Pain      Home Medications Prior to Admission medications   Medication Sig Start Date End Date Taking? Authorizing Provider  Fexofenadine HCl (ALLEGRA PO) Take 1 tablet by mouth daily.    [provider]  fluticasone (FLONASE) 50 MCG/ACT nasal spray USE 1 SPRAY(S) IN EACH NOSTRIL ONCE DAILY 02/17/18   [provider]  Multiple Vitamin (MULTIVITAMIN WITH MINERALS) TABS tablet Take 1 tablet by mouth daily.    [provider]  Omega-3 Fatty Acids (FISH OIL PO) Take 1 tablet by mouth daily.    [provider]  predniSONE (DELTASONE) 20 MG tablet TAKE 1 TABLET BY MOUTH ONCE DAILY FOR 10 DAYS 02/17/18   [provider]      Allergies    Patient has no known allergies.    Review of Systems   Review of Systems Ten systems reviewed and are negative for acute change, except as noted in the HPI.    Physical Exam Updated Vital Signs BP 122/78   Pulse 70   Temp 98.6 F (37 C) (Oral)   Resp 16   SpO2 96%   Physical Exam Vitals and nursing note reviewed.  Constitutional:      General: He is not in acute distress.    Appearance: He is well-developed. He is not diaphoretic.     Comments: Nontoxic appearing and in NAD  HENT:     Head: Normocephalic and  atraumatic.  Eyes:     General: No scleral icterus.    Conjunctiva/sclera: Conjunctivae normal.  Cardiovascular:     Rate and Rhythm: Normal rate and regular rhythm.     Pulses: Normal pulses.     Comments: DP pulse 2+ in the RLE. HR in the 80's. Pulmonary:     Effort: Pulmonary effort is normal. No respiratory distress.     Comments: Respirations even and unlabored Musculoskeletal:        General: Normal range of motion.     Cervical back: Normal range of motion.     Comments: Nonpitting edema of the RLE compared to left which is mild. There is some associated warmth to touch on the right as well as minimal erythema. No purpura, crepitus. Preserved ROM of the RLE.  Skin:    General: Skin is warm and dry.     Coloration: Skin is not pale.  Neurological:     Mental Status: He is alert and oriented to person, place, and time.     Coordination: Coordination normal.  Psychiatric:        Behavior: Behavior normal.     ED Results / Procedures / Treatments   Labs (all labs ordered are listed, but only abnormal results are displayed) Labs Reviewed  I-STAT CHEM 8, ED - Abnormal;  Notable for the following components:      Result Value   BUN 24 (*)    Glucose, Bld 106 (*)    TCO2 21 (*)    All other components within normal limits  CBC    EKG None  Radiology No results found.  Procedures Procedures    Medications Ordered in ED Medications - No data to display  ED Course/ Medical Decision Making/ A&P Clinical Course as of 05/03/23 0542  Mon May 03, 2023  4696 Concern for DVT in the right lower extremity, though no specific historical risk factors for this. Given chronicity, lack of fever, absence of lymphangitic streaking - doubt cellulitis. No crepitus. Compartments of the RLE are soft, compressible. Will necessitate LE venous duplex in AM. Plan to coordinate this at [facility] later this morning. [KH]  0541 No leukocytosis on CBC to suggest infectious process. [KH]     Clinical Course User Index [KH] Antony Madura, PA-C                                 Medical Decision Making Amount and/or Complexity of Data Reviewed Labs: ordered.   This patient presents to the ED for concern of RLE swelling, this involves an extensive number of treatment options, and is a complaint that carries with it a high risk of complications and morbidity.  The differential diagnosis includes dependent edema vs sprain/strain vs cellulitis vs DVT   Co morbidities that complicate the patient evaluation  None known   Lab Tests:  I Ordered, and personally interpreted labs.  The pertinent results include:  Normal CBC. Chem-8 also reassuring; no significant abnormalities.   Cardiac Monitoring:  The patient was maintained on a cardiac monitor.  I personally viewed and interpreted the cardiac monitored which showed an underlying rhythm of: NSR   Medicines ordered and prescription drug management:  I ordered medication including Lovenox for DVT treatment coverage  Reevaluation of the patient after these medicines showed that the patient stayed the same I have reviewed the patients home medicines and have made adjustments as needed   Test Considered:  D dimer   Problem List / ED Course:  47 year old male presenting for concerns of right lower extremity swelling.  Clinically, there is concern for DVT.  Unable to obtain ultrasound at this hour.  Therefore, patient given treatment with Lovenox and outpatient venous duplex scheduled for later this morning. Given symptom chronicity, absence of fever and leukocytosis, no criteria for SIRS/sepsis, low suspicion for infectious etiology.  No significant electrolyte derangements which could precipitate muscle spasms.  Extremity is warm, well-perfused.  Compartments are soft and compressible.  There is no concern for acute arterial occlusion. MSK etiology remains on the differential if aforementioned ultrasound  reassuring.   Social Determinants of Health:  Insured patient   Dispostion:  After consideration of the diagnostic results and the patients response to treatment, I feel that the patent would benefit from return in AM for vascular study. If negative, stable for f/u with his PCP. Return precautions provided. Patient discharged in stable condition with no unaddressed concerns.          Final Clinical Impression(s) / ED Diagnoses Final diagnoses:  Swelling of right lower extremity    Rx / DC Orders ED Discharge Orders          Ordered    LE VENOUS        05/03/23 0528  Antony Madura, PA-C 05/03/23 0617    Gilda Crease, MD 05/03/23 [phone]

## 2023-05-03 NOTE — Progress Notes (Signed)
VASCULAR LAB    Left lower extremity venous duplex has been performed.  See CV proc for preliminary results.  Reaching out to DVT clinic  Jackquelyn Sundberg, RVT 05/03/2023, 9:02 AM

## 2023-05-03 NOTE — Patient Instructions (Addendum)
-  Start apixaban (Eliquis) 10 mg twice daily for 7 days followed by 5 mg twice daily. -Your refills have been sent to [facility]. You may need to call the pharmacy to ask them to fill this when you start to run low on your current supply. [phone]. Open M-F 7:30am-6:00pm, Saturdays 8:00am-4:30pm -It is important to take your medication around the same time every day.  -Avoid NSAIDs like ibuprofen (Advil, Motrin) and naproxen (Aleve) as well as aspirin doses over 100 mg daily. -Tylenol (acetaminophen) is the preferred over the counter pain medication to lower the risk of bleeding. -Be sure to alert all of your health care providers that you are taking an anticoagulant prior to starting a new medication or having a procedure. -Monitor for signs and symptoms of bleeding (abnormal bruising, prolonged bleeding, nose bleeds, bleeding from gums, discolored urine, black tarry stools). If you have fallen and hit your head OR if your bleeding is severe or not stopping, seek emergency care.  -Go to the emergency room if emergent signs and symptoms of new clot occur (new or worse swelling and pain in an arm or leg, shortness of breath, chest pain, fast or irregular heartbeats, lightheadedness, dizziness, fainting, coughing up blood) or if you experience a significant color change (pale or blue) in the extremity that has the DVT.  -We recommend you wear compression stockings (20-30 mmHg) as long as you are having swelling or pain. Be sure to purchase the correct size and take them off at night.   If you have any questions or need to reschedule an appointment, please call [phone] [facility].  If you are having an emergency, call 911 or present to the nearest emergency room.   What is a DVT?  -Deep vein thrombosis (DVT) is a condition in which a blood clot forms in a vein of the deep venous system which can occur in the lower leg, thigh, pelvis, arm, or neck. This condition is serious and can be  life-threatening if the clot travels to the arteries of the lungs and causing a blockage (pulmonary embolism, PE). A DVT can also damage veins in the leg, which can lead to long-term venous disease, leg pain, swelling, discoloration, and ulcers or sores (post-thrombotic syndrome).  -Treatment may include taking an anticoagulant medication to prevent more clots from forming and the current clot from growing, wearing compression stockings, and/or surgical procedures to remove or dissolve the clot.

## 2023-05-03 NOTE — Addendum Note (Signed)
Encounter addended by: Cephus Shelling, MD on: 05/03/2023 12:17 PM  Actions taken: Clinical Note Signed

## 2023-05-03 NOTE — ED Triage Notes (Signed)
Right leg swelling and calf "cramping" for about a week.

## 2023-05-03 NOTE — Progress Notes (Signed)
ANTICOAGULATION CONSULT NOTE - Initial Consult  Pharmacy Consult for enoxaparin Indication: DVT  No Known Allergies  Patient Measurements: Height: 5\' 11"  (180.3 cm) Weight: 86.2 kg (190 lb) IBW/kg (Calculated) : 75.3 Heparin Dosing Weight:   Vital Signs: Temp: 98.6 F (37 C) (09/23 0511) Temp Source: Oral (09/23 0511) BP: 122/78 (09/23 0500) Pulse Rate: 70 (09/23 0511)  Labs: Recent Labs    05/03/23 0510 05/03/23 0522  HGB 13.6 14.1  HCT 40.0 41.5  PLT  --  249  CREATININE 0.90  --     Estimated Creatinine Clearance: 108.1 mL/min (by C-G formula based on SCr of 0.9 mg/dL).   Medical History: History reviewed. No pertinent past medical history.  Assessment: 47 yo male with rt leg swelling and calf  cramping for about a week. Pharmacy to dose enoxaparin for DVT.  No prior AC noted  CBC WNL, Scr 0.9  Goal of Therapy:  Anti-Xa level 0.6-1 units/ml 4hrs after LMWH dose given Monitor platelets by anticoagulation protocol: Yes   Plan:  Enoxaparin 1mg /kg ( 86mg ) SQ q12h Follow renal function and levels if needed  Arley Phenix RPh 05/03/2023, 5:55 AM

## 2023-05-03 NOTE — Progress Notes (Addendum)
DVT Clinic Note  Name: Collin Clark     MRN: [id number]     DOB: 11-28-1975     Sex: male  PCP: Bryon Lions, PA-C  Today's Visit: Visit Information: Initial Visit  Referred to DVT Clinic by: Emergency Department - Antony Madura, PA-C Referred to CPP by: Dr. Chestine Spore Reason for referral:  Chief Complaint  Patient presents with   DVT   HISTORY OF PRESENT ILLNESS: Collin Clark is a 47 y.o. male with no significant PMH who presents after diagnosis of DVT for medication management. He presented to the ED early this morning with leg pain and swelling for the past week. Last week he noticed having cramping that felt like a charlie horse in his right leg. This was occurring at night and was particularly painful when he got out of bed in the mornings. He noticed the swelling yesterday. The pain persisted and after reading online about what could be causing it, he decided to go the ED. He received a dose of Lovenox 85 mg at 0623 this morning and returned when vascular imaging opened. Ultrasound showed acute DVT involving the right popliteal, posterior tibial, and peroneal veins. He has no history of VTE. He is unsure of his family's history at this time. No CP, SOB, palpitations. 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 No recent injuries, illnesses, prolonged travel. He reports that he is not up to date on age-appropriate cancer screenings.   Positive Thrombotic Risk Factors: None Present Bleeding Risk Factors: None Present  Negative Thrombotic Risk Factors: Previous VTE, Recent surgery (within 3 months), Recent admission to hospital with acute illness (within 3 months), Recent trauma (within 3 months), Paralysis, paresis, or recent plaster cast immobilization of lower extremity, Central venous catheterization, Bed rest >72 hours within 3 months, Recent COVID diagnosis (within 3 months), Within 6 weeks postpartum, Erythropoiesis-stimulating agent, Active cancer, Smoking, Obesity,  Older age, Known thrombophilic condition, Pregnancy, Estrogen therapy, Recent cesarean section (within 3 months), Testosterone therapy, Non-malignant, chronic inflammatory condition, Sedentary journey lasting >8 hours within 4 weeks  Rx Insurance Coverage: Commercial Rx Affordability: Eliquis is $570/month on his insurance. Unclear if this is a deductible or what his copay would be each month. Filled for $0 today using one time free card. Activated copay card to reduce future refills to $10/month. It appears there is no monthly max for the copay card, only an annual max.  Rx Assistance Provided:  Free 30-day trial card Co-pay card Preferred Pharmacy: Refills sent to [facility] for pick up. Activated copay card available in pharmacy system.   No past medical history on file.  Past Surgical History:  Procedure Laterality Date   BRAIN SURGERY      Social History   Socioeconomic History   Marital status: Married    Spouse name: Not on file   Number of children: 2   Years of education: Not on file   Highest education level: Professional school degree (e.g., MD, DDS, DVM, JD)  Occupational History   Occupation: ATTORNEY    Employer: [employer]  Tobacco Use   Smoking status: Never   Smokeless tobacco: Never  Substance and Sexual Activity   Alcohol use: Yes    Alcohol/week: 1.0 standard drink of alcohol    Types: 1 Glasses of wine per week    Comment: wine or beer   Drug use: No   Sexual activity: Not on file  Other Topics Concern   Not on file  Social History Narrative   Patient is right-handed. He lives with his wife and 2 children in a 2 story house. He has been avoiding caffeine for the past 2 weeks. He has been unable to exercise recently.   Social Determinants of Health   Financial Resource Strain: Not on file  Food Insecurity: No Food Insecurity (05/12/2021)   Received from [facility]   Hunger Vital Sign    Worried About Running Out of Food in the Last Year:  Never true    Ran Out of Food in the Last Year: Never true  Transportation Needs: Not on file  Physical Activity: Not on file  Stress: Not on file  Social Connections: Unknown (12/22/2021)   Received from [facility]   Social Network    Social Network: Not on file  Intimate Partner Violence: Unknown (11/13/2021)   Received from [facility]   HITS    Physically Hurt: Not on file    Insult or Talk Down To: Not on file    Threaten Physical Harm: Not on file    Scream or Curse: Not on file    Family History  Problem Relation Age of Onset   Diabetes Mellitus II Mother    Hypertension Brother     Allergies as of 05/03/2023 - Review Complete 05/03/2023  Allergen Reaction Noted   Penicillins Other (See Comments) 02/16/2018    Current Outpatient Medications on File Prior to Encounter  Medication Sig Dispense Refill   Multiple Vitamin (MULTIVITAMIN WITH MINERALS) TABS tablet Take 1 tablet by mouth daily.     Omega-3 Fatty Acids (FISH OIL PO) Take 1 tablet by mouth daily.     No current facility-administered medications on file prior to encounter.   REVIEW OF SYSTEMS:  Review of Systems  Respiratory:  Negative for shortness of breath.   Cardiovascular:  Positive for leg swelling. Negative for chest pain and palpitations.  Musculoskeletal:  Positive for myalgias.  Neurological:  Positive for tingling. Negative for dizziness.   PHYSICAL EXAMINATION:  Vitals:   05/03/23 0940  BP: 114/75  Pulse: 74  SpO2: 99%   Physical Exam Vitals reviewed.  Cardiovascular:     Rate and Rhythm: Normal rate.  Pulmonary:     Effort: Pulmonary effort is normal.  Musculoskeletal:        General: Tenderness present.     Right lower leg: Edema (1+) present.     Left lower leg: No edema.  Skin:    Findings: No bruising or erythema.  Psychiatric:        Mood and Affect: Mood normal.        Behavior: Behavior normal.        Thought Content: Thought content normal.   Villalta Score for  Post-Thrombotic Syndrome: Pain: Severe Cramps: Severe Heaviness: Mild Paresthesia: Mild Pruritus: Absent Pretibial Edema: Moderate Skin Induration: Absent Hyperpigmentation: Absent Redness: Absent Venous Ectasia: Absent Pain on calf compression: Mild Villalta Preliminary Score: 11 Is venous ulcer present?: No If venous ulcer is present and score is <15, then 15 points total are assigned: Absent Villalta Total Score: 11  LABS:  CBC     Component Value Date/Time   WBC 7.8 05/03/2023 0522   RBC 4.48 05/03/2023 0522   HGB 14.1 05/03/2023 0522   HCT 41.5 05/03/2023 0522   PLT 249 05/03/2023 0522   MCV 92.6 05/03/2023 0522   MCH 31.5 05/03/2023 0522   MCHC 34.0 05/03/2023 0522   RDW 12.1 05/03/2023 0522   LYMPHSABS 1.2  02/18/2018 1532   MONOABS 0.4 02/18/2018 1532   EOSABS 0.1 02/18/2018 1532   BASOSABS 0.0 02/18/2018 1532    Renal Function   Lab Results  Component Value Date   CREATININE 0.90 05/03/2023   CREATININE 1.12 02/18/2018   CREATININE 1.02 01/16/2014    Estimated Creatinine Clearance: 108.1 mL/min (by C-G formula based on SCr of 0.9 mg/dL).   VVS Vascular Lab Studies:  05/03/23 VAS [place] LOWER EXTREMITY VENOUS LEFT (DVT)  Summary:  RIGHT:  - Findings consistent with acute deep vein thrombosis involving the right  popliteal vein, right posterior tibial veins, and right peroneal veins.  - No cystic structure found in the popliteal fossa.    LEFT:  - No evidence of common femoral vein obstruction.   ASSESSMENT: Location of DVT: Right popliteal vein, Right distal vein Cause of DVT: unprovoked - no risk factors for DVT identified at this time. Given this, will refer to hematology for further work up and recommendations on duration of treatment. He has plans to get up to date on his age appropriate cancer screenings which we also recommend. Will start Eliquis today. Labs are up to date and appropriate. Filled starter pack during his visit at [facility], and  refills have been sent to [facility] for him to pick up. He has a high copay but I provided him with an activated copay card to significantly reduce his cost. Since he got a dose of Lovenox (1 mg/kg) at 0623 this morning, he will start Eliquis this evening around 1800. Counseled patient extensively on the medication. Encouraged adherence techniques like setting an alarm since he doesn't take any other scheduled medications. All of his questions are answered at this time, and he was encouraged to call if any questions or concerns arise.   PLAN: -Start apixaban (Eliquis) 10 mg twice daily for 7 days followed by 5 mg twice daily. -Expected duration of therapy: per hematology. Therapy started on 05/03/23. -Patient educated on purpose, proper use and potential adverse effects of apixaban (Eliquis). -Discussed importance of taking medication around the same time every day. -Advised patient of medications to avoid (NSAIDs, aspirin doses >100 mg daily). -Educated that Tylenol (acetaminophen) is the preferred analgesic to lower the risk of bleeding. -Advised patient to alert all providers of anticoagulation therapy prior to starting a new medication or having a procedure. -Emphasized importance of monitoring for signs and symptoms of bleeding (abnormal bruising, prolonged bleeding, nose bleeds, bleeding from gums, discolored urine, black tarry stools). -Educated patient to present to the ED if emergent signs and symptoms of new thrombosis occur. -Counseled patient to wear compression stockings daily, removing at night. Reviewed appropriate method to elevate leg to help with swelling.  -Refills sent to patient's preferred pharmacy.   Follow up: Referral to hematology placed. DVT Clinic available as needed.   Pervis Hocking, PharmD, [place], CPP Deep Vein Thrombosis Clinic Clinical Pharmacist Practitioner Office: [phone]  I have evaluated the patient's chart/imaging and refer this patient to the  Clinical Pharmacist Practitioner for medication management. I have reviewed the CPP's documentation and agree with her assessment and plan. I was immediately available during the visit for questions and collaboration.   Cephus Shelling, MD

## 2023-05-03 NOTE — Discharge Instructions (Addendum)
You have an ultrasound scheduled for completion this morning at [facility].  Present as instructed for completion of this study to assess for a blood clot in your right leg.  If your study is positive for a blood clot, you will need to be on a blood thinner for approximately 6 months.  Return to the ED for any new or concerning symptoms.

## 2023-05-06 ENCOUNTER — Inpatient Hospital Stay: Payer: BC Managed Care – PPO

## 2023-05-06 ENCOUNTER — Other Ambulatory Visit: Payer: Self-pay

## 2023-05-06 ENCOUNTER — Inpatient Hospital Stay: Payer: BC Managed Care – PPO | Attending: Hematology | Admitting: Hematology

## 2023-05-06 VITALS — BP 118/78 | HR 67 | Temp 97.9°F | Resp 20 | Wt 197.3 lb

## 2023-05-06 DIAGNOSIS — I82441 Acute embolism and thrombosis of right tibial vein: Secondary | ICD-10-CM | POA: Diagnosis not present

## 2023-05-06 DIAGNOSIS — Z841 Family history of disorders of kidney and ureter: Secondary | ICD-10-CM | POA: Diagnosis not present

## 2023-05-06 DIAGNOSIS — I82451 Acute embolism and thrombosis of right peroneal vein: Secondary | ICD-10-CM | POA: Diagnosis not present

## 2023-05-06 DIAGNOSIS — Z832 Family history of diseases of the blood and blood-forming organs and certain disorders involving the immune mechanism: Secondary | ICD-10-CM | POA: Diagnosis not present

## 2023-05-06 DIAGNOSIS — Z8249 Family history of ischemic heart disease and other diseases of the circulatory system: Secondary | ICD-10-CM | POA: Diagnosis not present

## 2023-05-06 DIAGNOSIS — I82431 Acute embolism and thrombosis of right popliteal vein: Secondary | ICD-10-CM | POA: Diagnosis not present

## 2023-05-06 DIAGNOSIS — Z7901 Long term (current) use of anticoagulants: Secondary | ICD-10-CM | POA: Diagnosis not present

## 2023-05-06 LAB — CBC WITH DIFFERENTIAL (CANCER CENTER ONLY)
Abs Immature Granulocytes: 0.02 10*3/uL (ref 0.00–0.07)
Basophils Absolute: 0.1 10*3/uL (ref 0.0–0.1)
Basophils Relative: 1 %
Eosinophils Absolute: 0.1 10*3/uL (ref 0.0–0.5)
Eosinophils Relative: 1 %
HCT: 46.9 % (ref 39.0–52.0)
Hemoglobin: 16.3 g/dL (ref 13.0–17.0)
Immature Granulocytes: 0 %
Lymphocytes Relative: 33 %
Lymphs Abs: 2.6 10*3/uL (ref 0.7–4.0)
MCH: 31.7 pg (ref 26.0–34.0)
MCHC: 34.8 g/dL (ref 30.0–36.0)
MCV: 91.1 fL (ref 80.0–100.0)
Monocytes Absolute: 0.8 10*3/uL (ref 0.1–1.0)
Monocytes Relative: 10 %
Neutro Abs: 4.4 10*3/uL (ref 1.7–7.7)
Neutrophils Relative %: 55 %
Platelet Count: 340 10*3/uL (ref 150–400)
RBC: 5.15 MIL/uL (ref 4.22–5.81)
RDW: 11.9 % (ref 11.5–15.5)
WBC Count: 7.9 10*3/uL (ref 4.0–10.5)
nRBC: 0 % (ref 0.0–0.2)

## 2023-05-06 LAB — CMP (CANCER CENTER ONLY)
ALT: 21 U/L (ref 0–44)
AST: 14 U/L — ABNORMAL LOW (ref 15–41)
Albumin: 4.4 g/dL (ref 3.5–5.0)
Alkaline Phosphatase: 42 U/L (ref 38–126)
Anion gap: 6 (ref 5–15)
BUN: 17 mg/dL (ref 6–20)
CO2: 28 mmol/L (ref 22–32)
Calcium: 9.7 mg/dL (ref 8.9–10.3)
Chloride: 104 mmol/L (ref 98–111)
Creatinine: 1.01 mg/dL (ref 0.61–1.24)
GFR, Estimated: >60 mL/min (ref >60)
Glucose, Bld: 97 mg/dL (ref 70–99)
Potassium: 4.2 mmol/L (ref 3.5–5.1)
Sodium: 138 mmol/L (ref 135–145)
Total Bilirubin: 0.7 mg/dL (ref 0.3–1.2)
Total Protein: 7.6 g/dL (ref 6.5–8.1)

## 2023-05-06 LAB — ANTITHROMBIN III: AntiThromb III Func: 108 % (ref 75–120)

## 2023-05-07 LAB — HOMOCYSTEINE: Homocysteine: 13.8 umol/L (ref 0.0–14.5)

## 2023-05-07 LAB — BETA-2-GLYCOPROTEIN I ABS, IGG/M/A
Beta-2 Glyco I IgG: <9 GPI IgG units (ref 0–20)
Beta-2-Glycoprotein I IgA: <9 GPI IgA units (ref 0–25)
Beta-2-Glycoprotein I IgM: <9 GPI IgM units (ref 0–32)

## 2023-05-07 LAB — PSA, TOTAL AND FREE
PSA, Free Pct: 35.7 %
PSA, Free: 0.25 ng/mL
Prostate Specific Ag, Serum: 0.7 ng/mL (ref 0.0–4.0)

## 2023-05-07 LAB — CARDIOLIPIN ANTIBODIES, IGG, IGM, IGA
Anticardiolipin IgA: <9 [APL'U]/mL (ref 0–11)
Anticardiolipin IgG: <9 [GPL'U]/mL (ref 0–14)
Anticardiolipin IgM: <9 [MPL'U]/mL (ref 0–12)

## 2023-05-08 LAB — LUPUS ANTICOAGULANT PANEL
DRVVT: 58.6 s — ABNORMAL HIGH (ref 0.0–47.0)
PTT Lupus Anticoagulant: 33.9 s (ref 0.0–43.5)

## 2023-05-08 LAB — DRVVT MIX: dRVVT Mix: 46 s — ABNORMAL HIGH (ref 0.0–40.4)

## 2023-05-08 LAB — DRVVT CONFIRM: dRVVT Confirm: 1 {ratio} (ref 0.8–1.2)

## 2023-05-08 LAB — PROTEIN S ACTIVITY: Protein S Activity: 89 % (ref 63–140)

## 2023-05-08 LAB — PROTEIN C ACTIVITY: Protein C Activity: 115 % (ref 73–180)

## 2023-05-08 LAB — PROTEIN C, TOTAL: Protein C, Total: 92 % (ref 60–150)

## 2023-05-08 LAB — PROTEIN S, TOTAL: Protein S Ag, Total: 85 % (ref 60–150)

## 2023-05-09 LAB — MULTIPLE MYELOMA PANEL, SERUM
Albumin SerPl Elph-Mcnc: 3.9 g/dL (ref 2.9–4.4)
Albumin/Glob SerPl: 1.4 (ref 0.7–1.7)
Alpha 1: 0.3 g/dL (ref 0.0–0.4)
Alpha2 Glob SerPl Elph-Mcnc: 0.7 g/dL (ref 0.4–1.0)
B-Globulin SerPl Elph-Mcnc: 1.1 g/dL (ref 0.7–1.3)
Gamma Glob SerPl Elph-Mcnc: 0.9 g/dL (ref 0.4–1.8)
Globulin, Total: 3 g/dL (ref 2.2–3.9)
IgA: 324 mg/dL (ref 90–386)
IgG (Immunoglobin G), Serum: 1006 mg/dL (ref 603–1613)
IgM (Immunoglobulin M), Srm: 37 mg/dL (ref 20–172)
Total Protein ELP: 6.9 g/dL (ref 6.0–8.5)

## 2023-05-14 ENCOUNTER — Other Ambulatory Visit: Payer: BLUE CROSS/BLUE SHIELD

## 2023-05-14 ENCOUNTER — Other Ambulatory Visit (HOSPITAL_COMMUNITY): Payer: Self-pay

## 2023-05-17 LAB — PROTHROMBIN GENE MUTATION

## 2023-05-18 LAB — FACTOR 5 LEIDEN

## 2023-05-20 ENCOUNTER — Other Ambulatory Visit: Payer: Self-pay

## 2023-05-20 ENCOUNTER — Other Ambulatory Visit (HOSPITAL_COMMUNITY): Payer: Self-pay

## 2023-05-20 ENCOUNTER — Encounter (HOSPITAL_COMMUNITY): Payer: Self-pay

## 2023-05-20 ENCOUNTER — Other Ambulatory Visit: Payer: Self-pay | Admitting: Student-PharmD

## 2023-05-20 NOTE — Telephone Encounter (Signed)
Does not need to refill starter pack. Use Rx for 5 mg BID for refills, already sent to [facility].

## 2023-06-07 ENCOUNTER — Inpatient Hospital Stay: Payer: Managed Care, Other (non HMO) | Attending: Hematology | Admitting: Hematology

## 2023-06-07 NOTE — Progress Notes (Signed)
Called patient multiple times ( 6 times) on all provided numbers and he was unable to be reached to discuss his lab results. Phone appointment has been rescheduled This encounter was created in error - please disregard.

## 2023-06-08 ENCOUNTER — Telehealth: Payer: Self-pay | Admitting: Hematology

## 2023-06-08 ENCOUNTER — Encounter: Payer: Self-pay | Admitting: Hematology

## 2023-06-08 NOTE — Telephone Encounter (Signed)
Patient is aware of scheduled appointment times/dates, patient is requesting a telephone visit and message has been sent over to the desk nurse for Dr. Candise Che for further advising

## 2023-06-18 ENCOUNTER — Encounter: Payer: Self-pay | Admitting: Hematology

## 2023-06-29 ENCOUNTER — Other Ambulatory Visit (HOSPITAL_COMMUNITY): Payer: Self-pay

## 2023-06-29 ENCOUNTER — Inpatient Hospital Stay: Payer: BC Managed Care – PPO | Attending: Hematology | Admitting: Hematology

## 2023-06-29 VITALS — BP 108/78 | HR 61 | Temp 97.3°F | Resp 14 | Ht 71.0 in | Wt 199.0 lb

## 2023-06-29 DIAGNOSIS — Z7901 Long term (current) use of anticoagulants: Secondary | ICD-10-CM | POA: Insufficient documentation

## 2023-06-29 DIAGNOSIS — I82431 Acute embolism and thrombosis of right popliteal vein: Secondary | ICD-10-CM | POA: Diagnosis not present

## 2023-06-29 DIAGNOSIS — Z86718 Personal history of other venous thrombosis and embolism: Secondary | ICD-10-CM | POA: Insufficient documentation

## 2023-06-29 NOTE — Progress Notes (Signed)
HEMATOLOGY/ONCOLOGY CLINIC NOTE  Date of Service: 06/29/2023  Patient Care Team: Rosemary Holms as PCP - General (Physician Assistant)  CHIEF COMPLAINTS/PURPOSE OF CONSULTATION:  Evaluation and management of Acute deep vein thrombosis (DVT) of popliteal vein of right lower extremity   HISTORY OF PRESENTING ILLNESS:   Collin Clark is a wonderful 47 y.o. male who has been referred to [place] by Gilda Crease, MD  for evaluation and management of acute DVT of popliteal vein of right lower extremity.  He presented to the ED and was seen by Gilda Crease, MD on 05/03/2023 and complained of recurrent cramping in right calf with swelling and mild erythema. Venous duplex showed findings consistent with acute deep vein thrombosis involving the right popliteal vein, right posterior tibial veins, and right peroneal veins.  Patient reports that he began lifting weights two weeks prior to having a blood clot. He notes that he did perform squats but denies any unusual symptoms following his physical activity.  Patient reports that his symptoms presented after awakening to a charlie horse cramp that persisted x3 days. He did try taking supplemental vitamins. On the third day, his feet became swollen and red.   He continues to be on Eliquis since Monday. He reports that his pain has improved at this time. He does have swelling in his right lower extremity and lower right thigh at this time. Patient did not have any previous blood clots prior to recently.   Patient denies any recent surgery or long-distance travel. He notes that at its maximum, he has traveled for 2 hours at a time. He denies taking any testosterone-boosting supplements. Patient was taking creatine. 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   Patient reports that in 2019, he endorsed what he believed to be a neurological issue. He presented to the ER due to difficulty swallowing and  general weakness. The symptoms came on overnight. His pain/weakness began in his throat and radiated to his legs and hands. Patient reports that it took one year to fully return to his baseline. He reports that his ability to life weights has been limited since this episode. He was seen by Shon Millet, DO on 03/21/2018, who did not believe that patient's symptoms were neurologic. Since this episode, patient has not had any other medical issues in the past requiring medical attention. Patient denies any chronic medicl issues such as HTN or DM.  His paternal grandmother did have blood clots when she was in her 70s. Patient's great paternal aunt had a blood clot thought to be related to her medication. Patient denies any blood disorders or cancer in the family. His mother did have kidney failure when she was in her early 66s requiring dialysis and also has HTN. Patient's brother also has HTN. Patient denies having any HTN. He is not a smoker, and denies any alcohol use.   Patient denies any recent COVID vaccines. He reports that his wife was infected with COVID-19 3-4 months ago. He did have some symptoms around that time, but did not get tested for COVID-19.   He does report intentional weight loss attributed to increased physical activity and monitoring diet.   Patient endorses mild back pain sometimes. He notes that he may sometimes lean in unusual positions when sitting. He does cross his feet at times but denies completley crossing his legs. Patient notes that he has worn tight socks for a while.  Patient reports that his water intake  may have decreased recently due to switching to a smaller water bottle requiring more frequent refills. He notes having darker urine.   Over the last 6 months, he has endorsed 2-3 episodes of a burning sensation with urination, though this was not persistent and has resolved.   Patient has no other localized symptoms. He denies any SOB, abdominal pain, change in  bowel/urinary habits, chest pain, testicular pain/swelling, or back pain.  He reports that he has not been tested for prostate cancer yet.  INTERVAL HISTORY: Collin Clark is a wonderful 47 y.o. male who is here for continued evaluation and management of acute DVT of popliteal vein of right lower extremity. Patient was initially seen by me on 05/06/2023. Patient had a tele-med visit on 06/07/2023, but he was unable to be reached.   Patient notes he has been doing well overall since our last visit. He denies leg swelling, leg cramps, or leg pain. He has been taking Eliquis as prescribed. He has been wearing compression socks.   He denies any new infection issues, fever, chills, night sweats, unexpected weight loss, abdominal pain, change in bowel habits or urination,  back pain, chest pain, or leg swelling.   Patient denies any family history of cancer. He denies any past medical history of stomach ulcers.   MEDICAL HISTORY:  No past medical history on file.  SURGICAL HISTORY: Past Surgical History:  Procedure Laterality Date   BRAIN SURGERY      SOCIAL HISTORY: Social History   Socioeconomic History   Marital status: Married    Spouse name: Not on file   Number of children: 2   Years of education: Not on file   Highest education level: Professional school degree (e.g., MD, DDS, DVM, JD)  Occupational History   Occupation: ATTORNEY    Employer: [employer]  Tobacco Use   Smoking status: Never   Smokeless tobacco: Never  Substance and Sexual Activity   Alcohol use: Yes    Alcohol/week: 1.0 standard drink of alcohol    Types: 1 Glasses of wine per week    Comment: wine or beer   Drug use: No   Sexual activity: Not on file  Other Topics Concern   Not on file  Social History Narrative   Patient is right-handed. He lives with his wife and 2 children in a 2 story house. He has been avoiding caffeine for the past 2 weeks. He has been unable to exercise recently.    Social Determinants of Health   Financial Resource Strain: Not on file  Food Insecurity: No Food Insecurity (05/12/2021)   Received from [facility]   Hunger Vital Sign    Worried About Running Out of Food in the Last Year: Never true    Ran Out of Food in the Last Year: Never true  Transportation Needs: Not on file  Physical Activity: Not on file  Stress: Not on file  Social Connections: Unknown (12/22/2021)   Received from [facility]   Social Network    Social Network: Not on file  Intimate Partner Violence: Unknown (11/13/2021)   Received from [facility]   HITS    Physically Hurt: Not on file    Insult or Talk Down To: Not on file    Threaten Physical Harm: Not on file    Scream or Curse: Not on file    FAMILY HISTORY: Family History  Problem Relation Age of Onset   Diabetes Mellitus II Mother    Hypertension  Brother     ALLERGIES:  is allergic to penicillins.  MEDICATIONS:  Current Outpatient Medications  Medication Sig Dispense Refill   apixaban (ELIQUIS) 5 MG TABS tablet Take 1 tablet (5 mg total) by mouth 2 (two) times daily. Start taking after completion of starter pack. 60 tablet 5   APIXABAN (ELIQUIS) VTE STARTER PACK (10MG  AND 5MG ) Take as directed on package: start with two-5mg  tablets twice daily for 7 days. On day 8, switch to one-5mg  tablet twice daily. 74 each 0   Multiple Vitamin (MULTIVITAMIN WITH MINERALS) TABS tablet Take 1 tablet by mouth daily.     Omega-3 Fatty Acids (FISH OIL PO) Take 1 tablet by mouth daily.     No current facility-administered medications for this visit.    REVIEW OF SYSTEMS:    10 Point review of Systems was done is negative except as noted above.  PHYSICAL EXAMINATION: ECOG PERFORMANCE STATUS: 1 - Symptomatic but completely ambulatory  . There were no vitals filed for this visit.  There were no vitals filed for this visit.  .There is no height or weight on file to calculate BMI.  GENERAL:alert, in no acute  distress and comfortable SKIN: no acute rashes, no significant lesions EYES: conjunctiva are pink and non-injected, sclera anicteric OROPHARYNX: MMM, no exudates, no oropharyngeal erythema or ulceration NECK: supple, no JVD LYMPH:  no palpable lymphadenopathy in the cervical, axillary or inguinal regions LUNGS: clear to auscultation b/l with normal respiratory effort HEART: regular rate & rhythm ABDOMEN:  normoactive bowel sounds , non tender, not distended. Extremity: no pedal edema PSYCH: alert & oriented x 3 with fluent speech NEURO: no focal motor/sensory deficits  LABORATORY DATA:  I have reviewed the data as listed  .    Latest Ref Rng & Units 05/06/2023    2:07 PM 05/03/2023    5:22 AM 05/03/2023    5:10 AM  CBC  WBC 4.0 - 10.5 K/uL 7.9  7.8    Hemoglobin 13.0 - 17.0 g/dL 63.8  75.6  43.3   Hematocrit 39.0 - 52.0 % 46.9  41.5  40.0   Platelets 150 - 400 K/uL 340  249      .    Latest Ref Rng & Units 05/06/2023    2:07 PM 05/03/2023    5:10 AM 02/18/2018    3:32 PM  CMP  Glucose 70 - 99 mg/dL 97  295  188   BUN 6 - 20 mg/dL 17  24  15    Creatinine 0.61 - 1.24 mg/dL 4.16  6.06  3.01   Sodium 135 - 145 mmol/L 138  139  139   Potassium 3.5 - 5.1 mmol/L 4.2  4.1  4.5   Chloride 98 - 111 mmol/L 104  106  107   CO2 22 - 32 mmol/L 28   23   Calcium 8.9 - 10.3 mg/dL 9.7   9.5   Total Protein 6.5 - 8.1 g/dL 7.6     Total Bilirubin 0.3 - 1.2 mg/dL 0.7     Alkaline Phos 38 - 126 U/L 42     AST 15 - 41 U/L 14     ALT 0 - 44 U/L 21        RADIOGRAPHIC STUDIES: I have personally reviewed the radiological images as listed and agreed with the findings in the report. No results found.  ASSESSMENT & PLAN:   47 y.o. male with:  Acute deep vein thrombosis (DVT) of popliteal vein of right lower  extremity   PLAN: -Discussed lab results from 05/06/2023 in detail with the patient. CBC and CMP are stable. PSA level of 0.25 with PSA free percentage of 35.7%. dRVVT confirm ratio  of 1.0. Antithrombin III function of 108%. Protein-C activity of 115% with Protein-C total of 92%. Protein-S activity of 89% with Protein-S total of 85%. Lupus anticoagulant panel showed elevated DRVVT of 58.6 sec. Beta-2-Glycoprotein IgA, IgM, and IgG in the normal range.Homocysteine level of 13.8 umol/L. Factor-5 leiden not detected. Prothrombin gene mutation not detected. Anticardiolipin IgG. IgM, and IgA levels in the normal range.  -Discussed Multiple myeloma panel results from 05/06/2023 with the patient. Did not show M-protein.  -Discussed with the patient  that labs does not show any clotting disease.  -Discussed with the patient that COVID-19 could trigger blood clots. This could be a soft factor, unknown.  -Discussed with the patient that the next options could be continuing Eliquis with current dosage, start preventive dosage of blood thinner, or could discontinue blood thinner and start baby aspirin as pt's DVT was not severe and labs did not show any abnormalities.  -Discussed the option of D-dimer test for baseline. If D-dimer level is normal, then pt can continue baby aspirin and recheck D-dimer level in 3-4 months.  -Patient is leaning to proceed with baby aspirin option.  -Answered all of patient's questions regarding lab results and other questions regarding DVT. -Discussed the option of ultrasound of right leg. Patient agrees. -Schedule patient for ultrasound of right leg.  -Recommend to stay up-to-speed cancer screening, wearing compression socks, taking breaks during long-drive.  -Next plan: 1. Repeat ultrasound of right leg. 2. D-dimer test. 3. Transition to baby aspirin after labs in 6 weeks.  -For now, continue same dose of Eliquis for the next 2 months, until next visit in 6 weeks.   FOLLOW-UP: Labs in 6 weeks [place] RLE in 6 weeks Phone visit with Dr Candise Che in 7 weeks  The total time spent in the appointment was 25 minutes* .  All of the patient's questions were answered with  apparent satisfaction. The patient knows to call the clinic with any problems, questions or concerns.   Wyvonnia Lora MD MS AAHIVMS [facility] Hematology/Oncology Physician [facility]  .*Total Encounter Time as defined by the Centers for Medicare and Medicaid Services includes, in addition to the face-to-face time of a patient visit (documented in the note above) non-face-to-face time: obtaining and reviewing outside history, ordering and reviewing medications, tests or procedures, care coordination (communications with other health care professionals or caregivers) and documentation in the medical record.   I,Param Shah,acting as a Neurosurgeon for Wyvonnia Lora, MD.,have documented all relevant documentation on the behalf of Wyvonnia Lora, MD,as directed by  Wyvonnia Lora, MD while in the presence of Wyvonnia Lora, MD. ' .I have reviewed the above documentation for accuracy and completeness, and I agree with the above. Johney [place] MD

## 2023-07-01 ENCOUNTER — Telehealth: Payer: Self-pay | Admitting: Hematology

## 2023-07-01 NOTE — Telephone Encounter (Signed)
Patient requested an in person visit and patient is aware of scheduled appointment times/dates

## 2023-07-06 NOTE — Progress Notes (Incomplete)
HEMATOLOGY/ONCOLOGY CLINIC NOTE  Date of Service: 06/29/2023  Patient Care Team: Rosemary Holms as PCP - General (Physician Assistant)  CHIEF COMPLAINTS/PURPOSE OF CONSULTATION:  Evaluation and management of Acute deep vein thrombosis (DVT) of popliteal vein of right lower extremity   HISTORY OF PRESENTING ILLNESS:   Collin Clark is a wonderful 47 y.o. male who has been referred to [place] by Gilda Crease, MD  for evaluation and management of acute DVT of popliteal vein of right lower extremity.  He presented to the ED and was seen by Gilda Crease, MD on 05/03/2023 and complained of recurrent cramping in right calf with swelling and mild erythema. Venous duplex showed findings consistent with acute deep vein thrombosis involving the right popliteal vein, right posterior tibial veins, and right peroneal veins.  Patient reports that he began lifting weights two weeks prior to having a blood clot. He notes that he did perform squats but denies any unusual symptoms following his physical activity.  Patient reports that his symptoms presented after awakening to a charlie horse cramp that persisted x3 days. He did try taking supplemental vitamins. On the third day, his feet became swollen and red.   He continues to be on Eliquis since Monday. He reports that his pain has improved at this time. He does have swelling in his right lower extremity and lower right thigh at this time. Patient did not have any previous blood clots prior to recently.   Patient denies any recent surgery or long-distance travel. He notes that at its maximum, he has traveled for 2 hours at a time. He denies taking any testosterone-boosting supplements. Patient was taking creatine. 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   Patient reports that in 2019, he endorsed what he believed to be a neurological issue. He presented to the ER due to difficulty swallowing and  general weakness. The symptoms came on overnight. His pain/weakness began in his throat and radiated to his legs and hands. Patient reports that it took one year to fully return to his baseline. He reports that his ability to life weights has been limited since this episode. He was seen by Shon Millet, DO on 03/21/2018, who did not believe that patient's symptoms were neurologic. Since this episode, patient has not had any other medical issues in the past requiring medical attention. Patient denies any chronic medicl issues such as HTN or DM.  His paternal grandmother did have blood clots when she was in her 80s. Patient's great paternal aunt had a blood clot thought to be related to her medication. Patient denies any blood disorders or cancer in the family. His mother did have kidney failure when she was in her early 61s requiring dialysis and also has HTN. Patient's brother also has HTN. Patient denies having any HTN. He is not a smoker, and denies any alcohol use.   Patient denies any recent COVID vaccines. He reports that his wife was infected with COVID-19 3-4 months ago. He did have some symptoms around that time, but did not get tested for COVID-19.   He does report intentional weight loss attributed to increased physical activity and monitoring diet.   Patient endorses mild back pain sometimes. He notes that he may sometimes lean in unusual positions when sitting. He does cross his feet at times but denies completley crossing his legs. Patient notes that he has worn tight socks for a while.  Patient reports that his water intake  may have decreased recently due to switching to a smaller water bottle requiring more frequent refills. He notes having darker urine.   Over the last 6 months, he has endorsed 2-3 episodes of a burning sensation with urination, though this was not persistent and has resolved.   Patient has no other localized symptoms. He denies any SOB, abdominal pain, change in  bowel/urinary habits, chest pain, testicular pain/swelling, or back pain.  He reports that he has not been tested for prostate cancer yet.  INTERVAL HISTORY: Collin Clark is a wonderful 47 y.o. male who is here for continued evaluation and management of acute DVT of popliteal vein of right lower extremity. Patient was initially seen by me on 05/06/2023. Patient had a tele-med visit on 06/07/2023, but he was unable to be reached.   Patient notes he has been doing well overall since our last visit. He denies leg swelling, leg cramps, or leg pain. He has been taking Eliquis as prescribed. He has been wearing compression socks.   He denies any new infection issues, fever, chills, night sweats, unexpected weight loss, abdominal pain, change in bowel habits or urination,  back pain, chest pain, or leg swelling.   Patient denies any family history of cancer. He denies any past medical history of stomach ulcers.   MEDICAL HISTORY:  No past medical history on file.  SURGICAL HISTORY: Past Surgical History:  Procedure Laterality Date  . BRAIN SURGERY      SOCIAL HISTORY: Social History   Socioeconomic History  . Marital status: Married    Spouse name: Not on file  . Number of children: 2  . Years of education: Not on file  . Highest education level: Professional school degree (e.g., MD, DDS, DVM, JD)  Occupational History  . Occupation: ATTORNEY    Employer: [employer]  Tobacco Use  . Smoking status: Never  . Smokeless tobacco: Never  Substance and Sexual Activity  . Alcohol use: Yes    Alcohol/week: 1.0 standard drink of alcohol    Types: 1 Glasses of wine per week    Comment: wine or beer  . Drug use: No  . Sexual activity: Not on file  Other Topics Concern  . Not on file  Social History Narrative   Patient is right-handed. He lives with his wife and 2 children in a 2 story house. He has been avoiding caffeine for the past 2 weeks. He has been unable to  exercise recently.   Social Determinants of Health   Financial Resource Strain: Not on file  Food Insecurity: No Food Insecurity (05/12/2021)   [facility]   Hunger Vital Sign   . Worried About Programme researcher, broadcasting/film/video in the Last Year: Never true   . Ran Out of Food in the Last Year: Never true  Transportation Needs: Not on file  Physical Activity: Not on file  Stress: Not on file  Social Connections: Unknown (12/22/2021)   Received from [facility]   Social Network   . Social Network: Not on file  Intimate Partner Violence: Unknown (11/13/2021)   Received from [facility]   HITS   . Physically Hurt: Not on file   . Insult or Talk Down To: Not on file   . Threaten Physical Harm: Not on file   . Scream or Curse: Not on file    FAMILY HISTORY: Family History  Problem Relation Age of Onset  . Diabetes Mellitus II Mother   . Hypertension  Brother     ALLERGIES:  is allergic to penicillins.  MEDICATIONS:  Current Outpatient Medications  Medication Sig Dispense Refill  . apixaban (ELIQUIS) 5 MG TABS tablet Take 1 tablet (5 mg total) by mouth 2 (two) times daily. Start taking after completion of starter pack. 60 tablet 5  . APIXABAN (ELIQUIS) VTE STARTER PACK (10MG  AND 5MG ) Take as directed on package: start with two-5mg  tablets twice daily for 7 days. On day 8, switch to one-5mg  tablet twice daily. 74 each 0  . Multiple Vitamin (MULTIVITAMIN WITH MINERALS) TABS tablet Take 1 tablet by mouth daily.    . Omega-3 Fatty Acids (FISH OIL PO) Take 1 tablet by mouth daily.     No current facility-administered medications for this visit.    REVIEW OF SYSTEMS:    10 Point review of Systems was done is negative except as noted above.  PHYSICAL EXAMINATION: ECOG PERFORMANCE STATUS: 1 - Symptomatic but completely ambulatory  . There were no vitals filed for this visit.  There were no vitals filed for this visit.  .There is no height or weight on file to calculate  BMI.  GENERAL:alert, in no acute distress and comfortable SKIN: no acute rashes, no significant lesions EYES: conjunctiva are pink and non-injected, sclera anicteric OROPHARYNX: MMM, no exudates, no oropharyngeal erythema or ulceration NECK: supple, no JVD LYMPH:  no palpable lymphadenopathy in the cervical, axillary or inguinal regions LUNGS: clear to auscultation b/l with normal respiratory effort HEART: regular rate & rhythm ABDOMEN:  normoactive bowel sounds , non tender, not distended. Extremity: no pedal edema PSYCH: alert & oriented x 3 with fluent speech NEURO: no focal motor/sensory deficits  LABORATORY DATA:  I have reviewed the data as listed  .    Latest Ref Rng & Units 05/06/2023    2:07 PM 05/03/2023    5:22 AM 05/03/2023    5:10 AM  CBC  WBC 4.0 - 10.5 K/uL 7.9  7.8    Hemoglobin 13.0 - 17.0 g/dL 47.8  29.5  62.1   Hematocrit 39.0 - 52.0 % 46.9  41.5  40.0   Platelets 150 - 400 K/uL 340  249      .    Latest Ref Rng & Units 05/06/2023    2:07 PM 05/03/2023    5:10 AM 02/18/2018    3:32 PM  CMP  Glucose 70 - 99 mg/dL 97  308  657   BUN 6 - 20 mg/dL 17  24  15    Creatinine 0.61 - 1.24 mg/dL 8.46  9.62  9.52   Sodium 135 - 145 mmol/L 138  139  139   Potassium 3.5 - 5.1 mmol/L 4.2  4.1  4.5   Chloride 98 - 111 mmol/L 104  106  107   CO2 22 - 32 mmol/L 28   23   Calcium 8.9 - 10.3 mg/dL 9.7   9.5   Total Protein 6.5 - 8.1 g/dL 7.6     Total Bilirubin 0.3 - 1.2 mg/dL 0.7     Alkaline Phos 38 - 126 U/L 42     AST 15 - 41 U/L 14     ALT 0 - 44 U/L 21        RADIOGRAPHIC STUDIES: I have personally reviewed the radiological images as listed and agreed with the findings in the report. No results found.  ASSESSMENT & PLAN:  47 y.o. male with:  Acute deep vein thrombosis (DVT) of popliteal vein of right lower extremity  PLAN: -Discussed lab results from 05/06/2023 in detail with the patient. CBC and CMP are stable. PSA level of 0.25 with PSA free  percentage of 35.7%. dRVVT confirm ratio of 1.0. Antithrombin III function of 108%. Protein-C activity of 115% with Protein-C total of 92%. Protein-S activity of 89% with Protein-S total of 85%. Lupus anticoagulant panel showed elevated DRVVT of 58.6 sec. Beta-2-Glycoprotein IgA, IgM, and IgG in the normal range.Homocysteine level of 13.8 umol/L. Factor-5 leiden not detected. Prothrombin gene mutation not detected. Anticardiolipin IgG. IgM, and IgA levels in the normal range.  -Discussed Multiple myeloma panel results from 05/06/2023 with the patient. Did not show M-protein.  -Discussed with the patient  that labs does not show any clotting disease.  -Discussed with the patient that COVID-19 could trigger blood clots. This could be a soft factor, unknown.  -Discussed with the patient that the next options could be continuing Eliquis with current dosage, start preventive dosage of blood thinner, or could discontinue blood thinner and start baby aspirin as pt's DVT was not severe and labs did not show any abnormalities.  -Discussed the option of D-dimer test for baseline. If D-dimer level is normal, then pt can continue baby aspirin and recheck D-dimer level in 3-4 months.  -Patient is leaning to proceed with baby aspirin option.  -Answered all of patient's questions regarding lab results and other questions regarding DVT. -Discussed the option of ultrasound of right leg. Patient agrees. -Schedule patient for ultrasound of right leg.  -Recommend to stay up-to-speed cancer screening, wearing compression socks, taking breaks during long-drive.  -Next plan: 1. Repeat ultrasound of right leg. 2. D-dimer test. 3. Transition to baby aspirin after labs in 6 weeks.  -For now, continue same dose of Eliquis for the next 2 months, until next visit in 6 weeks.   FOLLOW-UP: Labs in 6 weeks [place] RLE in 6 weeks Phone visit with Dr Candise Che in 7 weeks  The total time spent in the appointment was *** minutes* .  All of  the patient's questions were answered with apparent satisfaction. The patient knows to call the clinic with any problems, questions or concerns.   Wyvonnia Lora MD MS AAHIVMS [facility] Hematology/Oncology [facility]  .*Total Encounter Time as defined by the Centers for Medicare and Medicaid Services includes, in addition to the face-to-face time of a patient visit (documented in the note above) non-face-to-face time: obtaining and reviewing outside history, ordering and reviewing medications, tests or procedures, care coordination (communications with other health care professionals or caregivers) and documentation in the medical record.   I,Param Shah,acting as a Neurosurgeon for Wyvonnia Lora, MD.,have documented all relevant documentation on the behalf of Wyvonnia Lora, MD,as directed by  Wyvonnia Lora, MD while in the presence of Wyvonnia Lora, MD.

## 2023-07-29 ENCOUNTER — Encounter: Payer: Self-pay | Admitting: Hematology

## 2023-07-30 ENCOUNTER — Other Ambulatory Visit (HOSPITAL_COMMUNITY): Payer: Self-pay

## 2023-08-09 ENCOUNTER — Other Ambulatory Visit (HOSPITAL_COMMUNITY): Payer: Self-pay

## 2023-08-17 ENCOUNTER — Other Ambulatory Visit: Payer: Self-pay

## 2023-08-17 DIAGNOSIS — I82431 Acute embolism and thrombosis of right popliteal vein: Secondary | ICD-10-CM

## 2023-08-19 ENCOUNTER — Other Ambulatory Visit: Payer: Self-pay

## 2023-08-19 DIAGNOSIS — I82431 Acute embolism and thrombosis of right popliteal vein: Secondary | ICD-10-CM

## 2023-08-20 ENCOUNTER — Inpatient Hospital Stay: Payer: BC Managed Care – PPO | Attending: Hematology

## 2023-08-20 DIAGNOSIS — Z7901 Long term (current) use of anticoagulants: Secondary | ICD-10-CM | POA: Diagnosis not present

## 2023-08-20 DIAGNOSIS — I82431 Acute embolism and thrombosis of right popliteal vein: Secondary | ICD-10-CM | POA: Insufficient documentation

## 2023-08-20 DIAGNOSIS — Z86718 Personal history of other venous thrombosis and embolism: Secondary | ICD-10-CM | POA: Insufficient documentation

## 2023-08-20 LAB — CBC WITH DIFFERENTIAL (CANCER CENTER ONLY)
Abs Immature Granulocytes: 0.01 10*3/uL (ref 0.00–0.07)
Basophils Absolute: 0 10*3/uL (ref 0.0–0.1)
Basophils Relative: 1 %
Eosinophils Absolute: 0 10*3/uL (ref 0.0–0.5)
Eosinophils Relative: 0 %
HCT: 45.6 % (ref 39.0–52.0)
Hemoglobin: 16 g/dL (ref 13.0–17.0)
Immature Granulocytes: 0 %
Lymphocytes Relative: 40 %
Lymphs Abs: 2.5 10*3/uL (ref 0.7–4.0)
MCH: 30.8 pg (ref 26.0–34.0)
MCHC: 35.1 g/dL (ref 30.0–36.0)
MCV: 87.9 fL (ref 80.0–100.0)
Monocytes Absolute: 0.4 10*3/uL (ref 0.1–1.0)
Monocytes Relative: 6 %
Neutro Abs: 3.4 10*3/uL (ref 1.7–7.7)
Neutrophils Relative %: 53 %
Platelet Count: 284 10*3/uL (ref 150–400)
RBC: 5.19 MIL/uL (ref 4.22–5.81)
RDW: 11.9 % (ref 11.5–15.5)
WBC Count: 6.4 10*3/uL (ref 4.0–10.5)
nRBC: 0 % (ref 0.0–0.2)

## 2023-08-20 LAB — CMP (CANCER CENTER ONLY)
ALT: 21 U/L (ref 0–44)
AST: 16 U/L (ref 15–41)
Albumin: 4.5 g/dL (ref 3.5–5.0)
Alkaline Phosphatase: 42 U/L (ref 38–126)
Anion gap: 11 (ref 5–15)
BUN: 12 mg/dL (ref 6–20)
CO2: 22 mmol/L (ref 22–32)
Calcium: 9.5 mg/dL (ref 8.9–10.3)
Chloride: 104 mmol/L (ref 98–111)
Creatinine: 1.03 mg/dL (ref 0.61–1.24)
GFR, Estimated: >60 mL/min (ref >60)
Glucose, Bld: 125 mg/dL — ABNORMAL HIGH (ref 70–99)
Potassium: 3.5 mmol/L (ref 3.5–5.1)
Sodium: 137 mmol/L (ref 135–145)
Total Bilirubin: 1 mg/dL (ref 0.0–1.2)
Total Protein: 7.2 g/dL (ref 6.5–8.1)

## 2023-08-25 NOTE — Progress Notes (Addendum)
 HEMATOLOGY/ONCOLOGY CLINIC NOTE  Date of Service: 08/27/2023  Patient Care Team: Rosemary Holms as PCP - General (Physician Assistant)  CHIEF COMPLAINTS/PURPOSE OF CONSULTATION:  Evaluation and management of Acute deep vein thrombosis (DVT) of popliteal vein of right lower extremity   HISTORY OF PRESENTING ILLNESS:   Collin Clark is a wonderful 48 y.o. male who has been referred to [place] by Gilda Crease, MD  for evaluation and management of acute DVT of popliteal vein of right lower extremity.  He presented to the ED and was seen by Gilda Crease, MD on 05/03/2023 and complained of recurrent cramping in right calf with swelling and mild erythema. Venous duplex showed findings consistent with acute deep vein thrombosis involving the right popliteal vein, right posterior tibial veins, and right peroneal veins.  Patient reports that he began lifting weights two weeks prior to having a blood clot. He notes that he did perform squats but denies any unusual symptoms following his physical activity.  Patient reports that his symptoms presented after awakening to a charlie horse cramp that persisted x3 days. He did try taking supplemental vitamins. On the third day, his feet became swollen and red.   He continues to be on Eliquis since Monday. He reports that his pain has improved at this time. He does have swelling in his right lower extremity and lower right thigh at this time. Patient did not have any previous blood clots prior to recently.   Patient denies any recent surgery or long-distance travel. He notes that at its maximum, he has traveled for 2 hours at a time. He denies taking any testosterone-boosting supplements. Patient was taking creatine. 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   Patient reports that in 2019, he endorsed what he believed to be a neurological issue. He presented to the ER due to difficulty swallowing and  general weakness. The symptoms came on overnight. His pain/weakness began in his throat and radiated to his legs and hands. Patient reports that it took one year to fully return to his baseline. He reports that his ability to life weights has been limited since this episode. He was seen by Shon Millet, DO on 03/21/2018, who did not believe that patient's symptoms were neurologic. Since this episode, patient has not had any other medical issues in the past requiring medical attention. Patient denies any chronic medicl issues such as HTN or DM.  His paternal grandmother did have blood clots when she was in her 72s. Patient's great paternal aunt had a blood clot thought to be related to her medication. Patient denies any blood disorders or cancer in the family. His mother did have kidney failure when she was in her early 107s requiring dialysis and also has HTN. Patient's brother also has HTN. Patient denies having any HTN. He is not a smoker, and denies any alcohol use.   Patient denies any recent COVID vaccines. He reports that his wife was infected with COVID-19 3-4 months ago. He did have some symptoms around that time, but did not get tested for COVID-19.   He does report intentional weight loss attributed to increased physical activity and monitoring diet.   Patient endorses mild back pain sometimes. He notes that he may sometimes lean in unusual positions when sitting. He does cross his feet at times but denies completley crossing his legs. Patient notes that he has worn tight socks for a while.  Patient reports that his water intake  may have decreased recently due to switching to a smaller water bottle requiring more frequent refills. He notes having darker urine.   Over the last 6 months, he has endorsed 2-3 episodes of a burning sensation with urination, though this was not persistent and has resolved.   Patient has no other localized symptoms. He denies any SOB, abdominal pain, change in  bowel/urinary habits, chest pain, testicular pain/swelling, or back pain.  He reports that he has not been tested for prostate cancer yet.  INTERVAL HISTORY: Collin Clark is a wonderful 48 y.o. male who is here for continued evaluation and management of acute DVT of popliteal vein of right lower extremity. Patient was last seen by me on 06/29/2023 and was doing well overall.   Today, he reports that he has been feeling well overall since his last clinical visit. Patient reports no new concerns. He denies any swelling or pain in the leg. Patient denies any new chest pain or SOB.   He does report random occasional upwards discomfort in the hamstrings which eventually resolves. Patient denies any symptoms in the calves.   He reports that he does engage in physical exercise sometimes.   He reports some mild lower back pain for several years which he attributes to lifting his child and is manageable. Patient denies any abdominal pain.   Patient reports that he is planning to travel to [place] at the end of February/start of March and plans to return after 9 days.   MEDICAL HISTORY:  No past medical history on file.  SURGICAL HISTORY: Patient denies any brain surgery as someone has placed in his surgical history  SOCIAL HISTORY: Social History   Socioeconomic History   Marital status: Married    Spouse name: Not on file   Number of children: 2   Years of education: Not on file   Highest education level: Professional school degree (e.g., MD, DDS, DVM, JD)  Occupational History   Occupation: ATTORNEY    Employer: [employer]  Tobacco Use   Smoking status: Never   Smokeless tobacco: Never  Substance and Sexual Activity   Alcohol use: Yes    Alcohol/week: 1.0 standard drink of alcohol    Types: 1 Glasses of wine per week    Comment: wine or beer   Drug use: No   Sexual activity: Not on file  Other Topics Concern   Not on file  Social History Narrative   Patient is  right-handed. He lives with his wife and 2 children in a 2 story house. He has been avoiding caffeine for the past 2 weeks. He has been unable to exercise recently.   Social Drivers of Corporate investment banker Strain: Not on file  Food Insecurity: No Food Insecurity (05/12/2021)   Received from [facility]   Hunger Vital Sign    Worried About Running Out of Food in the Last Year: Never true    Ran Out of Food in the Last Year: Never true  Transportation Needs: Not on file  Physical Activity: Not on file  Stress: Not on file  Social Connections: Unknown (12/22/2021)   Received from [facility]   Social Network    Social Network: Not on file  Intimate Partner Violence: Unknown (11/13/2021)   Received from [facility]   HITS    Physically Hurt: Not on file    Insult or Talk Down To: Not on file    Threaten Physical Harm: Not on file  Scream or Curse: Not on file    FAMILY HISTORY: Family History  Problem Relation Age of Onset   Diabetes Mellitus II Mother    Hypertension Brother     ALLERGIES:  is allergic to penicillins.  MEDICATIONS:  Current Outpatient Medications  Medication Sig Dispense Refill   apixaban (ELIQUIS) 5 MG TABS tablet Take 1 tablet (5 mg total) by mouth 2 (two) times daily. Start taking after completion of starter pack. 60 tablet 5   APIXABAN (ELIQUIS) VTE STARTER PACK (10MG  AND 5MG ) Take as directed on package: start with two-5mg  tablets twice daily for 7 days. On day 8, switch to one-5mg  tablet twice daily. 74 each 0   Multiple Vitamin (MULTIVITAMIN WITH MINERALS) TABS tablet Take 1 tablet by mouth daily.     Omega-3 Fatty Acids (FISH OIL PO) Take 1 tablet by mouth daily.     No current facility-administered medications for this visit.    REVIEW OF SYSTEMS:    10 Point review of Systems was done is negative except as noted above.   PHYSICAL EXAMINATION: ECOG PERFORMANCE STATUS: 1 - Symptomatic but completely ambulatory .BP (!) 132/91 (BP  Location: Left Arm, Patient Position: Sitting)   Pulse 79   Temp (!) 97.5 F (36.4 C) (Temporal)   Resp 18   Ht 5\' 11"  (1.803 m)   Wt 190 lb 12.8 oz (86.5 kg)   SpO2 99%   BMI 26.61 kg/m   GENERAL:alert, in no acute distress and comfortable SKIN: no acute rashes, no significant lesions EYES: conjunctiva are pink and non-injected, sclera anicteric OROPHARYNX: MMM, no exudates, no oropharyngeal erythema or ulceration NECK: supple, no JVD LYMPH:  no palpable lymphadenopathy in the cervical, axillary or inguinal regions LUNGS: clear to auscultation b/l with normal respiratory effort HEART: regular rate & rhythm ABDOMEN:  normoactive bowel sounds , non tender, not distended. Extremity: no pedal edema PSYCH: alert & oriented x 3 with fluent speech NEURO: no focal motor/sensory deficits   LABORATORY DATA:  I have reviewed the data as listed  .    Latest Ref Rng & Units 08/20/2023   11:54 AM 05/06/2023    2:07 PM 05/03/2023    5:22 AM  CBC  WBC 4.0 - 10.5 K/uL 6.4  7.9  7.8   Hemoglobin 13.0 - 17.0 g/dL 16.1  09.6  04.5   Hematocrit 39.0 - 52.0 % 45.6  46.9  41.5   Platelets 150 - 400 K/uL 284  340  249     .    Latest Ref Rng & Units 08/20/2023   11:54 AM 05/06/2023    2:07 PM 05/03/2023    5:10 AM  CMP  Glucose 70 - 99 mg/dL 409  97  811   BUN 6 - 20 mg/dL 12  17  24    Creatinine 0.61 - 1.24 mg/dL 9.14  7.82  9.56   Sodium 135 - 145 mmol/L 137  138  139   Potassium 3.5 - 5.1 mmol/L 3.5  4.2  4.1   Chloride 98 - 111 mmol/L 104  104  106   CO2 22 - 32 mmol/L 22  28    Calcium 8.9 - 10.3 mg/dL 9.5  9.7    Total Protein 6.5 - 8.1 g/dL 7.2  7.6    Total Bilirubin 0.0 - 1.2 mg/dL 1.0  0.7    Alkaline Phos 38 - 126 U/L 42  42    AST 15 - 41 U/L 16  14  ALT 0 - 44 U/L 21  21       RADIOGRAPHIC STUDIES: I have personally reviewed the radiological images as listed and agreed with the findings in the report. No results found.  ASSESSMENT & PLAN:   48 y.o. male  with:  Acute deep vein thrombosis (DVT) of popliteal vein of right lower extremity   PLAN:  -Discussed lab results from 08/20/2023 in detail with patient. CBC showed WBC of 6.4K, hemoglobin of 16.0, and platelets of 284K. -CMP normal -his blood glucose in clinic today is 125. Recommend having A1C testing with PCP to test his average glucose levels over 3 months -no sign of evolving focal symptoms suggestive of cancer/tumor at this time and the chance of this is very unlikely.  -will order D dimer testing today. Discussed that this would evaluate for any biochemical signs of new clot formation -Will set up lower extremity US to evaluate his new baseline post anticoagulation. Do not anticipate any new concerns.  -It is possible that there is mildly increased swelling in one of his legs. Discussed that it is sometimes possible for there to be unequal muscle mass to some degree. We shall review his upcoming LE US findings.  -advised patient that during his upcoming travels to [place], he should at least take coated version of 81 MG baby aspirin, and could potentially choose to take Eliquis around 48 hours around the time of travel then switch back to aspirin as it would be reasonable -Also advised patient to wear compression socks regularly, stay very well-hydrated, move around frequently every 1-2 hours, and avoid excessive caffeine and alcohol use.  -Patient continues to be on Eliquis at this time with no significant bleeding issues.  -discussed that if his D dimer and lower extremity US is negative, he shall continue 81 MG Aspirin for 6-12 months then stop. After this period, he may choose to take Aspirin only during events that may increase blood clotting risks, such as with traveling or surgeries  -discussed that there may also be a role for preventative blood thinners around the time of surgeries -difficult to justify long term use of Aspirin given the absence of concerning clotting-related findings  on testing -educated patient that NSAID medications can increase the risk of ulcers and bleeding. Discussed that while it would be okay to take Ibuprofen once in a while, he should preferably take Tylenol if needed for pain management.  -answered all of patient's questions in detail  FOLLOW-UP: Labs today US venous rt lower extremity today RTC with Dr Candise Che as needed  The total time spent in the appointment was 20 minutes* .  All of the patient's questions were answered with apparent satisfaction. The patient knows to call the clinic with any problems, questions or concerns.   Wyvonnia Lora MD MS AAHIVMS [facility] Hematology/Oncology Physician [facility]  .*Total Encounter Time as defined by the Centers for Medicare and Medicaid Services includes, in addition to the face-to-face time of a patient visit (documented in the note above) non-face-to-face time: obtaining and reviewing outside history, ordering and reviewing medications, tests or procedures, care coordination (communications with other health care professionals or caregivers) and documentation in the medical record.    I,Mitra Faeizi,acting as a Neurosurgeon for Wyvonnia Lora, MD.,have documented all relevant documentation on the behalf of Wyvonnia Lora, MD,as directed by  Wyvonnia Lora, MD while in the presence of Wyvonnia Lora, MD.  .I have reviewed the above documentation for accuracy and completeness, and I  agree with the above. Johney Maine MD

## 2023-08-26 ENCOUNTER — Other Ambulatory Visit (HOSPITAL_COMMUNITY): Payer: Self-pay

## 2023-08-27 ENCOUNTER — Inpatient Hospital Stay: Payer: BC Managed Care – PPO | Admitting: Hematology

## 2023-08-27 ENCOUNTER — Inpatient Hospital Stay: Payer: BC Managed Care – PPO

## 2023-08-27 ENCOUNTER — Ambulatory Visit (HOSPITAL_BASED_OUTPATIENT_CLINIC_OR_DEPARTMENT_OTHER)
Admission: RE | Admit: 2023-08-27 | Discharge: 2023-08-27 | Disposition: A | Discharge status: Home / Self Care | Payer: BC Managed Care – PPO | Source: Ambulatory Visit | Attending: Hematology | Admitting: Hematology

## 2023-08-27 VITALS — BP 132/91 | HR 79 | Temp 97.5°F | Resp 18 | Ht 71.0 in | Wt 190.8 lb

## 2023-08-27 DIAGNOSIS — I82431 Acute embolism and thrombosis of right popliteal vein: Secondary | ICD-10-CM

## 2023-08-27 LAB — D-DIMER, QUANTITATIVE: D-Dimer, Quant: <0.27 ug{FEU}/mL (ref 0.00–0.50)

## 2023-09-01 ENCOUNTER — Other Ambulatory Visit (HOSPITAL_COMMUNITY): Payer: Self-pay

## 2023-09-23 ENCOUNTER — Other Ambulatory Visit (HOSPITAL_COMMUNITY): Payer: Self-pay

## 2023-09-23 ENCOUNTER — Other Ambulatory Visit: Payer: Self-pay

## 2023-10-29 ENCOUNTER — Encounter: Payer: Self-pay | Admitting: Hematology

## 2023-11-02 NOTE — Progress Notes (Signed)
 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 His D-dimer was negative and did not show any evidence of active clot formation. He is okay to switch to baby aspirin 81 mg p.o. daily. Recommend good hydration and continued use of compression socks. Would recommend continue the baby aspirin for 1 year and then during periods of increased risk of VTE like long distance travel. If there is any evidence of recurrent clotting that would lead to recommendations of long-term anticoagulation. Pt acknowledged information and verbalized understanding.
